# Patient Record
Sex: Male | Born: 1935 | Race: White | Hispanic: No | Marital: Married | Smoking: Never smoker
Health system: Southern US, Community
[De-identification: ages and names within clinical notes are randomized; demographics above are authoritative.]

## PROBLEM LIST (undated history)

## (undated) DIAGNOSIS — Z6820 Body mass index (BMI) 20.0-20.9, adult: Secondary | ICD-10-CM

## (undated) DIAGNOSIS — C801 Malignant (primary) neoplasm, unspecified: Secondary | ICD-10-CM

## (undated) DIAGNOSIS — I10 Essential (primary) hypertension: Secondary | ICD-10-CM

## (undated) DIAGNOSIS — R002 Palpitations: Secondary | ICD-10-CM

## (undated) DIAGNOSIS — N289 Disorder of kidney and ureter, unspecified: Secondary | ICD-10-CM

## (undated) HISTORY — DX: Body mass index (BMI) 20.0-20.9, adult: Z68.20

## (undated) HISTORY — DX: Palpitations: R00.2

---

## 2005-08-31 ENCOUNTER — Ambulatory Visit: Payer: Self-pay | Admitting: Internal Medicine

## 2005-08-31 ENCOUNTER — Ambulatory Visit (HOSPITAL_COMMUNITY): Admission: RE | Admit: 2005-08-31 | Discharge: 2005-08-31 | Payer: Self-pay | Admitting: Internal Medicine

## 2007-06-02 ENCOUNTER — Ambulatory Visit: Admission: RE | Admit: 2007-06-02 | Discharge: 2007-08-31 | Payer: Self-pay | Admitting: Radiation Oncology

## 2007-09-03 ENCOUNTER — Ambulatory Visit: Admission: RE | Admit: 2007-09-03 | Discharge: 2007-10-18 | Payer: Self-pay | Admitting: Radiation Oncology

## 2007-10-10 ENCOUNTER — Encounter: Admission: RE | Admit: 2007-10-10 | Discharge: 2007-10-10 | Payer: Self-pay | Admitting: Urology

## 2007-10-26 ENCOUNTER — Ambulatory Visit (HOSPITAL_BASED_OUTPATIENT_CLINIC_OR_DEPARTMENT_OTHER): Admission: RE | Admit: 2007-10-26 | Discharge: 2007-10-26 | Payer: Self-pay | Admitting: Urology

## 2007-11-16 ENCOUNTER — Ambulatory Visit: Admission: RE | Admit: 2007-11-16 | Discharge: 2007-12-13 | Payer: Self-pay | Admitting: Radiation Oncology

## 2008-05-10 ENCOUNTER — Encounter: Payer: Self-pay | Admitting: Cardiology

## 2008-05-10 ENCOUNTER — Ambulatory Visit: Payer: Self-pay | Admitting: Cardiology

## 2009-10-22 ENCOUNTER — Inpatient Hospital Stay (HOSPITAL_COMMUNITY)
Admission: AD | Admit: 2009-10-22 | Discharge: 2009-10-31 | Disposition: A | Discharge status: Rehabilitation Facility | Payer: Self-pay | Source: Home / Self Care | Admitting: Cardiology

## 2009-10-22 ENCOUNTER — Ambulatory Visit: Payer: Self-pay | Admitting: Cardiovascular Disease

## 2009-10-22 ENCOUNTER — Encounter: Payer: Self-pay | Admitting: Physician Assistant

## 2009-10-22 ENCOUNTER — Encounter: Payer: Self-pay | Admitting: Cardiology

## 2009-10-23 ENCOUNTER — Ambulatory Visit: Payer: Self-pay | Admitting: Thoracic Surgery (Cardiothoracic Vascular Surgery)

## 2009-10-23 ENCOUNTER — Encounter: Payer: Self-pay | Admitting: Thoracic Surgery (Cardiothoracic Vascular Surgery)

## 2009-10-23 ENCOUNTER — Encounter: Payer: Self-pay | Admitting: Cardiovascular Disease

## 2009-10-23 ENCOUNTER — Ambulatory Visit: Payer: Self-pay | Admitting: Cardiology

## 2009-10-24 ENCOUNTER — Encounter: Payer: Self-pay | Admitting: Physician Assistant

## 2009-10-24 ENCOUNTER — Encounter: Payer: Self-pay | Admitting: Cardiovascular Disease

## 2009-10-24 HISTORY — PX: CORONARY ARTERY BYPASS GRAFT: SHX141

## 2009-10-25 ENCOUNTER — Encounter: Payer: Self-pay | Admitting: Physician Assistant

## 2009-10-28 ENCOUNTER — Encounter: Payer: Self-pay | Admitting: Physician Assistant

## 2009-10-29 ENCOUNTER — Encounter: Payer: Self-pay | Admitting: Physician Assistant

## 2009-10-29 ENCOUNTER — Encounter: Payer: Self-pay | Admitting: Thoracic Surgery

## 2009-10-30 ENCOUNTER — Ambulatory Visit: Payer: Self-pay | Admitting: Physical Medicine & Rehabilitation

## 2009-10-31 ENCOUNTER — Inpatient Hospital Stay (HOSPITAL_COMMUNITY)
Admission: RE | Admit: 2009-10-31 | Discharge: 2009-11-05 | Payer: Self-pay | Admitting: Physical Medicine & Rehabilitation

## 2009-10-31 ENCOUNTER — Ambulatory Visit: Payer: Self-pay | Admitting: Physical Medicine & Rehabilitation

## 2009-10-31 ENCOUNTER — Encounter: Payer: Self-pay | Admitting: Physician Assistant

## 2009-11-04 ENCOUNTER — Encounter: Payer: Self-pay | Admitting: Physician Assistant

## 2009-11-12 ENCOUNTER — Encounter (HOSPITAL_COMMUNITY)
Admission: RE | Admit: 2009-11-12 | Discharge: 2009-12-12 | Payer: Self-pay | Source: Home / Self Care | Admitting: Physical Medicine & Rehabilitation

## 2009-11-18 ENCOUNTER — Telehealth (INDEPENDENT_AMBULATORY_CARE_PROVIDER_SITE_OTHER): Payer: Self-pay | Admitting: *Deleted

## 2009-11-26 ENCOUNTER — Telehealth (INDEPENDENT_AMBULATORY_CARE_PROVIDER_SITE_OTHER): Payer: Self-pay | Admitting: *Deleted

## 2009-11-26 ENCOUNTER — Ambulatory Visit: Payer: Self-pay | Admitting: Physician Assistant

## 2009-11-26 ENCOUNTER — Encounter: Payer: Self-pay | Admitting: Cardiology

## 2009-11-26 DIAGNOSIS — J90 Pleural effusion, not elsewhere classified: Secondary | ICD-10-CM | POA: Insufficient documentation

## 2009-11-26 DIAGNOSIS — E785 Hyperlipidemia, unspecified: Secondary | ICD-10-CM | POA: Insufficient documentation

## 2009-11-26 DIAGNOSIS — I6350 Cerebral infarction due to unspecified occlusion or stenosis of unspecified cerebral artery: Secondary | ICD-10-CM | POA: Insufficient documentation

## 2009-11-26 DIAGNOSIS — I2510 Atherosclerotic heart disease of native coronary artery without angina pectoris: Secondary | ICD-10-CM | POA: Insufficient documentation

## 2009-12-09 ENCOUNTER — Encounter
Admission: RE | Admit: 2009-12-09 | Discharge: 2009-12-16 | Payer: Self-pay | Source: Home / Self Care | Admitting: Physical Medicine & Rehabilitation

## 2009-12-12 ENCOUNTER — Encounter: Payer: Self-pay | Admitting: Cardiology

## 2009-12-13 ENCOUNTER — Encounter (HOSPITAL_COMMUNITY)
Admission: RE | Admit: 2009-12-13 | Discharge: 2010-01-12 | Payer: Self-pay | Source: Home / Self Care | Attending: Physical Medicine & Rehabilitation | Admitting: Physical Medicine & Rehabilitation

## 2009-12-16 ENCOUNTER — Encounter: Payer: Self-pay | Admitting: Cardiology

## 2009-12-16 ENCOUNTER — Encounter
Admission: RE | Admit: 2009-12-16 | Discharge: 2009-12-16 | Payer: Self-pay | Admitting: Thoracic Surgery (Cardiothoracic Vascular Surgery)

## 2009-12-16 ENCOUNTER — Ambulatory Visit: Payer: Self-pay | Admitting: Physical Medicine & Rehabilitation

## 2009-12-16 ENCOUNTER — Ambulatory Visit: Payer: Self-pay | Admitting: Thoracic Surgery (Cardiothoracic Vascular Surgery)

## 2010-01-08 ENCOUNTER — Encounter (HOSPITAL_COMMUNITY)
Admission: RE | Admit: 2010-01-08 | Discharge: 2010-02-07 | Payer: Self-pay | Source: Home / Self Care | Attending: Cardiology | Admitting: Cardiology

## 2010-01-15 ENCOUNTER — Encounter (INDEPENDENT_AMBULATORY_CARE_PROVIDER_SITE_OTHER): Payer: Self-pay | Admitting: *Deleted

## 2010-01-15 ENCOUNTER — Encounter (HOSPITAL_COMMUNITY)
Admission: RE | Admit: 2010-01-15 | Discharge: 2010-02-11 | Payer: Self-pay | Source: Home / Self Care | Attending: Physical Medicine & Rehabilitation | Admitting: Physical Medicine & Rehabilitation

## 2010-01-30 ENCOUNTER — Encounter: Payer: Self-pay | Admitting: Cardiology

## 2010-02-11 NOTE — Cardiovascular Report (Signed)
Summary: [place]   [place]   Imported By: Roderic Ovens 11/06/2009 12:24:17  _____________________________________________________________________  External Attachment:    Type:   Image     Comment:   External Document

## 2010-02-11 NOTE — Progress Notes (Signed)
Summary: cardiac rehab  Phone Note Other Incoming   Caller: Burnis Medin - Cardiac Rehab Summary of Call: phone:  [phone]   fax:  [phone]  Checking to see if this had been done yet.  Informed her that OV for 11/10 was cancelled and rescheduled for 11/15.  Advised her that we will be happy to take care of this as soon as he has his post hosp visit.  Diane states that pt is questioning her because he wants to start this after his physical therapy there.  She inform the patient. Initial call taken by: Hoover Brunette, LPN,  November 26, 2009 9:14 AM     Appended Document: cardiac rehab Diane [facility] called back stating that he has seen the PA on 11/15, but nothing was done about him getting order for this per patient.  Advised her that I would forward this note to MD.  She states that she will check to see if initial order from hospital discharge was sent to [facility] and will let us know.    Appended Document: cardiac rehab Di I  need to do anything with this?  Appended Document: cardiac rehab Need okay for cardiac rehab.  Appended Document: cardiac rehab the patient can proceed with cardiac rehabilitation  Appended Document: cardiac rehab Patient notified.   Will send referral to [facility] at pt request.

## 2010-02-11 NOTE — Op Note (Signed)
Summary: Brett Leonard        Imported By: Roderic Ovens 11/06/2009 12:43:01  _____________________________________________________________________  External Attachment:    Type:   Image     Comment:   External Document

## 2010-02-11 NOTE — Consult Note (Signed)
Summary: CARDIOLOGY CONSULT/ [facility]  CARDIOLOGY CONSULT/ [facility]   Imported By: Zachary George 11/26/2009 10:59:54  _____________________________________________________________________  External Attachment:    Type:   Image     Comment:   External Document

## 2010-02-11 NOTE — Consult Note (Signed)
Summary: [place]   [place]   Imported By: Roderic Ovens 11/06/2009 12:42:04  _____________________________________________________________________  External Attachment:    Type:   Image     Comment:   External Document

## 2010-02-11 NOTE — Progress Notes (Signed)
Summary: referral to cardiac rehab for [facility]  Phone Note Other Incoming Call back at [phone]   Caller: Hart Rochester - mgr of cardiac rehab of [facility] Summary of Call: Pt there for PT and will be needing cardiac rehab afterwards.    States she spoke with pt and he wants to come to [facility].  Seeing MD on 11/10.  Please send referral for this.  Initial call taken by: Hoover Brunette, LPN,  November 18, 2009 4:01 PM

## 2010-02-11 NOTE — Miscellaneous (Signed)
Summary: Rehab Report/ FAXED CARDIAC REHAB ORDER  Rehab Report/ FAXED CARDIAC REHAB ORDER   Imported By: Dorise Hiss 12/13/2009 11:21:16  _____________________________________________________________________  External Attachment:    Type:   Image     Comment:   External Document

## 2010-02-11 NOTE — Assessment & Plan Note (Signed)
Summary: Brett Leonard 10/19   Visit Type:  Follow-up Primary Brett Leonard:  Brett Leonard   History of Present Illness: patient presents for initial post CABG clinic visit. He initially presented to Dr. Andee Lineman, here at [facility], with no prior history of heart disease, and had symptoms worrisome for UAP, in the setting of mildly elevated troponins.  Coronary angiography revealed severe, three-vessel CAD with normal LVEF. He was referred to Dr.Owen, who proceeded with four-vessel CABG: LIMA-LAD; SVG-DX1; SVG-OM1; and, SVG-PL branch.  Unfortunately, the patient developed an acute right non hemorrhagic parietal stroke on postop day 3, with associated left hemiparesis. He was placed on aspirin/Plavix, per neurology, and is scheduled to follow with Dr. Anne Hahn.  Clinically, he has done well since discharge from rehabilitation facility on October 25. He is being assisted by OT and PT, but has not yet been cleared to enroll in cardiac rehabilitation. He is scheduled to see Dr. Cornelius Moras, next month. He is ambulating 3 times a day, denies any other chest pain which precipitated his initial presentation. He does complain of occasional, intermittent left pleuritic discomfort, just underneath the left rib. He denies any symptoms suggestive of orthopnea, PND, lower extremity edema.  Preventive Screening-Counseling & Management  Alcohol-Tobacco     Smoking Status: never  Current Medications (verified): 1)  Metoprolol Tartrate 25 Mg Tabs (Metoprolol Tartrate) .... Take 1/4 Tablet By Mouth Two Times A Day 2)  Tamsulosin Hcl 0.4 Mg Caps (Tamsulosin Hcl) .... Take 1 Tablet By Mouth Once A Day 3)  Aspirin 325 Mg Tabs (Aspirin) .... Take 1 Tablet By Mouth Once A Day 4)  Plavix 75 Mg Tabs (Clopidogrel Bisulfate) .... Take 1 Tablet By Mouth Once A Day 5)  Colace 100 Mg Caps (Docusate Sodium) .... Take 2 Tablet By Mouth Once A Day 6)  Oxycodone Hcl 5 Mg Tabs (Oxycodone Hcl) .... Take 1-2 By Mouth Every 4  Hours As Needed 7)  Tylenol 325 Mg Tabs (Acetaminophen) .... Take 1-2 By Mouth Every 4 Hours As Needed  Allergies (verified): 1)  ! Darvocet  Comments:  Nurse/Medical Assistant: The patient's medication list and allergies were reviewed with the patient and were updated in the Medication and Allergy Lists.  Social History: Smoking Status:  never  Review of Systems       No fevers, chills, hemoptysis, dysphagia, melena, hematocheezia, hematuria, rash, claudication, orthopnea, pnd, pedal edema. All other systems negative.   Vital Signs:  Patient profile:   77 year old male Height:      67 inches Weight:      145 pounds BMI:     22.79 Pulse rate:   92 / minute BP sitting:   122 / 80  (left arm) Cuff size:   regular  Vitals Entered By: Carlye Grippe (November 26, 2009 11:12 AM)  Physical Exam  Additional Exam:  GEN: 77 year old male, sitting upright, no distress HEENT: NCAT,PERRLA,EOMI NECK: palpable pulses, no bruits; no JVD; no TM LUNGS: mild left base crackles, no wheezes HEART: RRR (S1S2); no significant murmurs; no rubs; no gallops ABD: soft, NT; intact BS EXT: intact distal pulses; no edema SKIN: warm, drysacrum well-healed sternal midline incision MUSC: no obvious deformity NEURO: A/O (x3)     EKG  Procedure date:  11/26/2009  Findings:      normal sinus rhythm at 91 bpm; normal axis; nonspecific ST changes  Impression & Recommendations:  Problem # 1:  CAD (ICD-414.00)  patient is doing well from a cardiac stand point, following recent  presentation with NSTEMI, and subsequent multivessel CABG. Left ventricular function is normal. Unfortunately, he suffered a postop, nonhemorrhagic stroke. He is slowly regaining strength in his left arm and leg. Recommendation was to treat with concomitant aspirin and Plavix, and he is scheduled to follow with Dr. Anne Hahn. Will defer to Dr. Anne Hahn regarding duration of Plavix therapy, for his stroke. We'll otherwise continue  full dose aspirin. Patient also was placed on low dose metoprolol, although they are unsure of the dose. They will contact our office with the specific dose, at which time I will make a recommendation as to whether or not to increase this. He does present with a high basal heart rate.  Problem # 2:  CEREBRAL INFARCTION (ICD-434.91)  patient progressing slowly. His is receiving occupational and physical therapy. We'll follow up with Dr. Lesia Sago, in [place].  Problem # 3:  PLEURAL EFFUSION, LEFT (ICD-511.9)  Will order a two-view chest x-ray to rule out residual left pleural effusion. We'll check baseline labs, including BNP level. May need to add a diuretic. Of note, he was not discharged on furosemide  Problem # 4:  DYSLIPIDEMIA (ICD-272.4)  Will initiate aggressive lipid management with pravastatin 40 mg daily. We'll need surveillance last in 12 weeks. Spoke extensively with the patient regarding risk/benefits of this class of medications, including potential adverse effects of liver damage or possible rhabdomyolysis.  Other Orders: EKG w/ Interpretation (93000) T-Chest x-ray, 2 views (16109) T-Basic Metabolic Panel (60454-09811) T-BNP  (B Natriuretic Peptide) ([id number])  Patient Instructions: 1)  Your physician wants you to follow-up in: 3 months. You will receive a reminder letter in the mail one-two months in advance. If you don't receive a letter, please call our office to schedule the follow-up appointment. 2)  CALL THE OFFICE WHEN YOU GET HOME WITH THE CORRECT DOSE OF YOUR METOPROLOL. 3)  Your physician recommends that you go to the [facility] for lab work and a chest x-ray. Please do today. 4)  Start Pravachol (pravastatin) 40mg  by mouth at bedtime.  5)  Your physician recommends that you go to the [facility] for a FASTING lipid profile and liver function labs:  DO IN 12 WEEKS. Prescriptions: PRAVASTATIN SODIUM 40 MG TABS (PRAVASTATIN SODIUM) Take one tablet by  mouth daily at bedtime  #30 x 6   Entered by:   Cyril Loosen, RN, BSN   Authorized by:   Nelida Meuse, PA-C   Signed by:   Cyril Loosen, RN, BSN on 11/26/2009   Method used:   Electronically to        [facility] [phone]* (retail)       [address]       [place], [place]  [address]       Ph: [phone] or [phone]       Fax: [phone]   RxID:   [id number]   Handout requested.  I have reviewed and approved all prescriptions at the time of the office visit. Nelida Meuse, PA-C  November 26, 2009 12:09 PM

## 2010-02-11 NOTE — Letter (Signed)
Summary: External Correspondence/ [facility] Dimas Aguas  External Correspondence/ [facility] Dim[facility] Agu[facility]   Imported By: Dorise Hiss 12/04/2009 09:04:14  _____________________________________________________________________  External Attachment:    Type:   Image     Comment:   External Document

## 2010-02-12 ENCOUNTER — Ambulatory Visit (HOSPITAL_COMMUNITY): Payer: Medicare Other | Attending: Cardiology

## 2010-02-12 DIAGNOSIS — Z951 Presence of aortocoronary bypass graft: Secondary | ICD-10-CM | POA: Insufficient documentation

## 2010-02-12 DIAGNOSIS — Z5189 Encounter for other specified aftercare: Secondary | ICD-10-CM | POA: Insufficient documentation

## 2010-02-12 DIAGNOSIS — I200 Unstable angina: Secondary | ICD-10-CM | POA: Insufficient documentation

## 2010-02-12 DIAGNOSIS — I2510 Atherosclerotic heart disease of native coronary artery without angina pectoris: Secondary | ICD-10-CM | POA: Insufficient documentation

## 2010-02-12 DIAGNOSIS — I252 Old myocardial infarction: Secondary | ICD-10-CM | POA: Insufficient documentation

## 2010-02-13 NOTE — Letter (Signed)
Summary: [facility] Office Visit Note   [facility] Office Visit Note   Imported By: Roderic Ovens 01/10/2010 12:04:38  _____________________________________________________________________  External Attachment:    Type:   Image     Comment:   External Document

## 2010-02-13 NOTE — Miscellaneous (Signed)
Summary: Orders Update  Clinical Lists Changes  Orders: Added new Test order of T-Lipid Profile ([id number]) - Signed Added new Test order of T-Hepatic Function ([id number]) - Signed 

## 2010-02-14 ENCOUNTER — Ambulatory Visit (HOSPITAL_COMMUNITY): Payer: Medicare Other | Attending: Cardiology

## 2010-02-14 DIAGNOSIS — Z951 Presence of aortocoronary bypass graft: Secondary | ICD-10-CM | POA: Insufficient documentation

## 2010-02-14 DIAGNOSIS — Z5189 Encounter for other specified aftercare: Secondary | ICD-10-CM | POA: Insufficient documentation

## 2010-02-14 DIAGNOSIS — I2510 Atherosclerotic heart disease of native coronary artery without angina pectoris: Secondary | ICD-10-CM | POA: Insufficient documentation

## 2010-02-17 ENCOUNTER — Ambulatory Visit (HOSPITAL_COMMUNITY): Payer: Medicare Other | Attending: Cardiology

## 2010-02-17 DIAGNOSIS — Z951 Presence of aortocoronary bypass graft: Secondary | ICD-10-CM | POA: Insufficient documentation

## 2010-02-17 DIAGNOSIS — I2510 Atherosclerotic heart disease of native coronary artery without angina pectoris: Secondary | ICD-10-CM | POA: Insufficient documentation

## 2010-02-17 DIAGNOSIS — Z5189 Encounter for other specified aftercare: Secondary | ICD-10-CM | POA: Insufficient documentation

## 2010-02-17 DIAGNOSIS — I200 Unstable angina: Secondary | ICD-10-CM | POA: Insufficient documentation

## 2010-02-19 ENCOUNTER — Ambulatory Visit (INDEPENDENT_AMBULATORY_CARE_PROVIDER_SITE_OTHER): Payer: Medicare Other | Admitting: Cardiology

## 2010-02-19 ENCOUNTER — Ambulatory Visit (HOSPITAL_COMMUNITY): Payer: Medicare Other | Attending: Cardiology

## 2010-02-19 ENCOUNTER — Encounter: Payer: Self-pay | Admitting: Cardiology

## 2010-02-19 DIAGNOSIS — Z5189 Encounter for other specified aftercare: Secondary | ICD-10-CM | POA: Insufficient documentation

## 2010-02-19 DIAGNOSIS — I2510 Atherosclerotic heart disease of native coronary artery without angina pectoris: Secondary | ICD-10-CM | POA: Insufficient documentation

## 2010-02-19 DIAGNOSIS — Z951 Presence of aortocoronary bypass graft: Secondary | ICD-10-CM | POA: Insufficient documentation

## 2010-02-19 DIAGNOSIS — I200 Unstable angina: Secondary | ICD-10-CM | POA: Insufficient documentation

## 2010-02-21 ENCOUNTER — Ambulatory Visit (HOSPITAL_COMMUNITY): Payer: Medicare Other | Attending: Cardiology

## 2010-02-21 DIAGNOSIS — Z5189 Encounter for other specified aftercare: Secondary | ICD-10-CM | POA: Insufficient documentation

## 2010-02-21 DIAGNOSIS — I252 Old myocardial infarction: Secondary | ICD-10-CM | POA: Insufficient documentation

## 2010-02-21 DIAGNOSIS — I2510 Atherosclerotic heart disease of native coronary artery without angina pectoris: Secondary | ICD-10-CM | POA: Insufficient documentation

## 2010-02-21 DIAGNOSIS — I200 Unstable angina: Secondary | ICD-10-CM | POA: Insufficient documentation

## 2010-02-21 DIAGNOSIS — Z951 Presence of aortocoronary bypass graft: Secondary | ICD-10-CM | POA: Insufficient documentation

## 2010-02-24 ENCOUNTER — Ambulatory Visit (HOSPITAL_COMMUNITY): Payer: Medicare Other | Attending: Cardiology

## 2010-02-24 DIAGNOSIS — I2510 Atherosclerotic heart disease of native coronary artery without angina pectoris: Secondary | ICD-10-CM | POA: Insufficient documentation

## 2010-02-24 DIAGNOSIS — Z951 Presence of aortocoronary bypass graft: Secondary | ICD-10-CM | POA: Insufficient documentation

## 2010-02-24 DIAGNOSIS — Z5189 Encounter for other specified aftercare: Secondary | ICD-10-CM | POA: Insufficient documentation

## 2010-02-24 DIAGNOSIS — I200 Unstable angina: Secondary | ICD-10-CM | POA: Insufficient documentation

## 2010-02-26 ENCOUNTER — Ambulatory Visit (HOSPITAL_COMMUNITY): Payer: Medicare Other | Attending: Cardiology

## 2010-02-26 DIAGNOSIS — I2510 Atherosclerotic heart disease of native coronary artery without angina pectoris: Secondary | ICD-10-CM | POA: Insufficient documentation

## 2010-02-26 DIAGNOSIS — Z5189 Encounter for other specified aftercare: Secondary | ICD-10-CM | POA: Insufficient documentation

## 2010-02-26 DIAGNOSIS — Z951 Presence of aortocoronary bypass graft: Secondary | ICD-10-CM | POA: Insufficient documentation

## 2010-02-26 DIAGNOSIS — I200 Unstable angina: Secondary | ICD-10-CM | POA: Insufficient documentation

## 2010-02-27 NOTE — Assessment & Plan Note (Signed)
Summary: [facility]ENT ONLY  3 MO FU /SRS   Visit Type:  Follow-up Primary Provider:  [name]   History of Present Illness: The patient is a 77 year old male with a history of coronary artery bypass grafting 3x complicated by post operative stroke. To the patient still has some weakness in the left arm. Th he reports occasional any typical chest pains but reports no exertional symptoms. He reports no Dyspnea on exertion The patient has recent carotid Dopplers a which showed no significant disease. Overall is doing well after surgery. Your parents no or copy of PND palpitations or syncope . The patient is participating in cardiac rehab.   Preventive Screening-Counseling & Management  Alcohol-Tobacco     Smoking Status: never  Current Medications (verified): 1)  Metoprolol Tartrate 25 Mg Tabs (Metoprolol Tartrate) .... Take 1/2 Tablet By Mouth Two Times A Day 2)  Tamsulosin Hcl 0.4 Mg Caps (Tamsulosin Hcl) .... Take 1 Tablet By Mouth Once A Day 3)  Aspirin 325 Mg Tabs (Aspirin) .... Take 1 Tablet By Mouth Once A Day 4)  Plavix 75 Mg Tabs (Clopidogrel Bisulfate) .... Take 1 Tablet By Mouth Once A Day 5)  Colace 100 Mg Caps (Docusate Sodium) .... Take 1 Tablet By Mouth Once A Day 6)  Oxycodone Hcl 5 Mg Tabs (Oxycodone Hcl) .... Take 1-2 By Mouth Every 4 Hours As Needed 7)  Tylenol 325 Mg Tabs (Acetaminophen) .... Take 1-2 By Mouth Every 4 Hours As Needed 8)  Pravastatin Sodium 40 Mg Tabs (Pravastatin Sodium) .... Take One Tablet By Mouth Daily At Bedtime  Allergies (verified): 1)  ! Darvocet  Comments:  Nurse/Medical Assistant: The patient's medication list and allergies were reviewed with the patient and were updated in the Medication and Allergy Lists.  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  Risk Factors: Smoking  Status: never (02/19/2010)  Past Medical History: Prostate Cancer History of BPH Chest wall pain Coronary artery bypass grafting Post operative stroke followed by Neurology residual decrease in strength left arm   Review of Systems  The patient denies fatigue, malaise, fever, weight gain/loss, vision loss, decreased hearing, hoarseness, chest pain, palpitations, shortness of breath, prolonged cough, wheezing, sleep apnea, coughing up blood, abdominal pain, blood in stool, nausea, vomiting, diarrhea, heartburn, incontinence, blood in urine, muscle weakness, joint pain, leg swelling, rash, skin lesions, headache, fainting, dizziness, depression, anxiety, enlarged lymph nodes, easy bruising or bleeding, and environmental allergies.    Vital Signs:  Patient profile:   77 year old male Height:      67 inches Weight:      144 pounds Pulse rate:   65 / minute BP sitting:   121 / 67  (right arm) Cuff size:   regular  Vitals Entered By: Carlye Grippe (February 19, 2010 3:03 PM)  Physical Exam  Additional Exam:  General: Well-developed, well-nourished in no distress head: Normocephalic and atraumatic eyes PERRLA/EOMI intact, conjunctiva and lids normal nose: No deformity or lesions mouth normal dentition, normal posterior pharynx neck: Supple, no JVD.  No masses, thyromegaly or abnormal cervical nodes lungs: Normal breath sounds bilaterally without wheezing.  Normal percussion heart: regular rate and rhythm with normal S1 and S2, no S3 or S4.  PMI is normal.  No pathological murmurs abdomen: Normal bowel sounds, abdomen is soft and nontender without masses, organomegaly  or hernias noted.  No hepatosplenomegaly musculoskeletal: Back normal, normal gait muscle strength and tone normal pulsus: Pulse is normal in all 4 extremities Extremities: No peripheral pitting edema neurologic: Alert and oriented x 3 skin: Intact without lesions or rashes cervical nodes: No significant  adenopathy psychologic: Normal affect    Impression & Recommendations:  Problem # 1:  CAD (ICD-414.00)  His updated medication list for this problem includes:    Metoprolol Tartrate 25 Mg Tabs (Metoprolol tartrate) .Marland Kitchen... Take 1/2 tablet by mouth two times a day    Aspirin 325 Mg Tabs (Aspirin) .Marland Kitchen... Take 1 tablet by mouth once a day    Plavix 75 Mg Tabs (Clopidogrel bisulfate) .Marland Kitchen... Take 1 tablet by mouth once a day  Problem # 2:  DYSLIPIDEMIA (ICD-272.4) Lipid lowering therapy doing well.  His updated medication list for this problem includes:    Pravastatin Sodium 40 Mg Tabs (Pravastatin sodium) .Marland Kitchen... Take one tablet by mouth daily at bedtime  Problem # 3:  PLEURAL EFFUSION, LEFT (ICD-511.9) No recurrence.   Patient Instructions: 1)  Your physician recommends that you continue on your current medications as directed. Please refer to the Current Medication list given to you today. 2)  Follow up in  6 months

## 2010-02-28 ENCOUNTER — Encounter (HOSPITAL_COMMUNITY): Payer: Medicare Other

## 2010-03-03 ENCOUNTER — Ambulatory Visit (HOSPITAL_COMMUNITY): Payer: Medicare Other

## 2010-03-05 ENCOUNTER — Ambulatory Visit (HOSPITAL_COMMUNITY): Payer: Medicare Other

## 2010-03-07 ENCOUNTER — Ambulatory Visit (HOSPITAL_COMMUNITY): Payer: Medicare Other

## 2010-03-10 ENCOUNTER — Ambulatory Visit (HOSPITAL_COMMUNITY): Payer: Medicare Other

## 2010-03-11 NOTE — Miscellaneous (Signed)
Summary: Rehab Report  Rehab Report   Imported By: Dorise Hiss 03/04/2010 11:26:42  _____________________________________________________________________  External Attachment:    Type:   Image     Comment:   External Document

## 2010-03-12 ENCOUNTER — Ambulatory Visit (HOSPITAL_COMMUNITY): Payer: Medicare Other

## 2010-03-14 ENCOUNTER — Ambulatory Visit (HOSPITAL_COMMUNITY): Payer: Medicare Other | Attending: Cardiology

## 2010-03-14 DIAGNOSIS — Z951 Presence of aortocoronary bypass graft: Secondary | ICD-10-CM | POA: Insufficient documentation

## 2010-03-14 DIAGNOSIS — I2510 Atherosclerotic heart disease of native coronary artery without angina pectoris: Secondary | ICD-10-CM | POA: Insufficient documentation

## 2010-03-14 DIAGNOSIS — I200 Unstable angina: Secondary | ICD-10-CM | POA: Insufficient documentation

## 2010-03-14 DIAGNOSIS — Z5189 Encounter for other specified aftercare: Secondary | ICD-10-CM | POA: Insufficient documentation

## 2010-03-17 ENCOUNTER — Ambulatory Visit (HOSPITAL_COMMUNITY): Payer: Medicare Other

## 2010-03-19 ENCOUNTER — Ambulatory Visit (HOSPITAL_COMMUNITY): Payer: Medicare Other

## 2010-03-21 ENCOUNTER — Ambulatory Visit (HOSPITAL_COMMUNITY): Payer: Medicare Other

## 2010-03-24 ENCOUNTER — Ambulatory Visit (HOSPITAL_COMMUNITY): Payer: Medicare Other

## 2010-03-24 ENCOUNTER — Encounter: Payer: Self-pay | Admitting: Thoracic Surgery (Cardiothoracic Vascular Surgery)

## 2010-03-24 ENCOUNTER — Encounter (INDEPENDENT_AMBULATORY_CARE_PROVIDER_SITE_OTHER): Payer: Medicare Other | Admitting: Thoracic Surgery (Cardiothoracic Vascular Surgery)

## 2010-03-24 DIAGNOSIS — I2510 Atherosclerotic heart disease of native coronary artery without angina pectoris: Secondary | ICD-10-CM

## 2010-03-24 NOTE — Assessment & Plan Note (Signed)
OFFICE VISIT  ORIEN, MAYHALL DOB:  1932-01-08                                        March 24, 2010 CHART #:  [id number]  HISTORY OF PRESENT ILLNESS:  The patient returns for followup status post coronary artery bypass grafting x4 on October 24, 2009.  He was last seen here in the office on December 16, 2009.  Since then he has continued to do quite well.  He states that he has continued to enjoy gradual recovery in the amount that he can use his left upper extremity. He still states that he does not have fine motor use of his fingers and he cannot tie his shoes using his left hand, but otherwise he has enjoyed considerable increase in its mobility and usefulness.  He states that he still gets tired and his strength is not back to normal, but all of this is slowly improving.  He has not had any other problems or complaints and overall he feels like he is doing fine.  The remainder of his review of systems is notable only in that he did have some bright red blood in his stool recently for which he has been holding aspirin for the last few weeks.  PHYSICAL EXAMINATION:  GENERAL:  A well-appearing male.  VITAL SIGNS: Blood pressure 125/74, pulse 76 and regular, and oxygen saturation 97% on room air.  CHEST:  Notable for median sternotomy scar that is healed completely.  The sternum is stable on palpation.  Auscultation reveals clear breath sounds that are symmetrical bilaterally.  CARDIOVASCULAR: Regular rate and rhythm.  No murmurs, rubs, or gallops are noted. ABDOMEN:  Soft and nontender.  EXTREMITIES:  Warm and well perfused. There is no lower extremity edema.  NEUROLOGIC:  The patient has considerable more movement and use of his left arm.  It is still weak and he still does not have fine motor dexterity with his fingers. Overall, this is dramatically improved in comparison with the last time I saw him.  IMPRESSION:  The patient is progressing nicely  and the symptoms from his previous stroke seem to be resolving gradually.  PLAN:  In the future, the patient will call or return to see [place] as needed.  All of his questions have been addressed.  Salvatore Decent. Cornelius Moras, M.D. Electronically Signed  CHO/MEDQ  D:  03/24/2010  T:  03/24/2010  Job:  [id number]  cc:   Learta Codding, MD,FACC Selinda Flavin

## 2010-03-26 ENCOUNTER — Ambulatory Visit (HOSPITAL_COMMUNITY): Payer: Medicare Other

## 2010-03-26 LAB — CBC
HCT: 30.1 % — ABNORMAL LOW (ref 39.0–52.0)
HCT: 34 % — ABNORMAL LOW (ref 39.0–52.0)
Hemoglobin: 10.3 g/dL — ABNORMAL LOW (ref 13.0–17.0)
Hemoglobin: 11.2 g/dL — ABNORMAL LOW (ref 13.0–17.0)
MCH: 29.9 pg (ref 26.0–34.0)
MCH: 30.1 pg (ref 26.0–34.0)
MCHC: 32.9 g/dL (ref 30.0–36.0)
MCHC: 34.2 g/dL (ref 30.0–36.0)
MCV: 88 fL (ref 78.0–100.0)
MCV: 90.7 fL (ref 78.0–100.0)
Platelets: 146 10*3/uL — ABNORMAL LOW (ref 150–400)
Platelets: 275 10*3/uL (ref 150–400)
RBC: 3.42 MIL/uL — ABNORMAL LOW (ref 4.22–5.81)
RBC: 3.75 MIL/uL — ABNORMAL LOW (ref 4.22–5.81)
RDW: 14.5 % (ref 11.5–15.5)
RDW: 14.8 % (ref 11.5–15.5)
WBC: 8.8 10*3/uL (ref 4.0–10.5)
WBC: 9.4 10*3/uL (ref 4.0–10.5)

## 2010-03-26 LAB — BASIC METABOLIC PANEL
BUN: 15 mg/dL (ref 6–23)
BUN: 17 mg/dL (ref 6–23)
CO2: 26 mEq/L (ref 19–32)
CO2: 30 mEq/L (ref 19–32)
Calcium: 8.4 mg/dL (ref 8.4–10.5)
Calcium: 8.5 mg/dL (ref 8.4–10.5)
Chloride: 104 mEq/L (ref 96–112)
Chloride: 104 mEq/L (ref 96–112)
Creatinine, Ser: 1.09 mg/dL (ref 0.4–1.5)
Creatinine, Ser: 1.12 mg/dL (ref 0.4–1.5)
GFR calc Af Amer: >60 mL/min (ref >60)
GFR calc Af Amer: >60 mL/min (ref >60)
GFR calc non Af Amer: >60 mL/min (ref >60)
GFR calc non Af Amer: >60 mL/min (ref >60)
Glucose, Bld: 112 mg/dL — ABNORMAL HIGH (ref 70–99)
Glucose, Bld: 118 mg/dL — ABNORMAL HIGH (ref 70–99)
Potassium: 3.5 mEq/L (ref 3.5–5.1)
Potassium: 4.2 mEq/L (ref 3.5–5.1)
Sodium: 136 mEq/L (ref 135–145)
Sodium: 139 mEq/L (ref 135–145)

## 2010-03-26 LAB — DIFFERENTIAL
Basophils Absolute: 0.1 10*3/uL (ref 0.0–0.1)
Basophils Relative: 1 % (ref 0–1)
Eosinophils Absolute: 0.5 10*3/uL (ref 0.0–0.7)
Neutro Abs: 6 10*3/uL (ref 1.7–7.7)
Neutrophils Relative %: 63 % (ref 43–77)

## 2010-03-26 LAB — GLUCOSE, CAPILLARY: Glucose-Capillary: 145 mg/dL — ABNORMAL HIGH (ref 70–99)

## 2010-03-26 LAB — COMPREHENSIVE METABOLIC PANEL
ALT: 40 U/L (ref 0–53)
Alkaline Phosphatase: 62 U/L (ref 39–117)
BUN: 18 mg/dL (ref 6–23)
CO2: 26 mEq/L (ref 19–32)
Chloride: 106 mEq/L (ref 96–112)
GFR calc non Af Amer: >60 mL/min (ref >60)
Glucose, Bld: 107 mg/dL — ABNORMAL HIGH (ref 70–99)
Potassium: 4.4 mEq/L (ref 3.5–5.1)
Sodium: 137 mEq/L (ref 135–145)
Total Bilirubin: 1.1 mg/dL (ref 0.3–1.2)

## 2010-03-27 LAB — CBC
HCT: 24.6 % — ABNORMAL LOW (ref 39.0–52.0)
HCT: 27.1 % — ABNORMAL LOW (ref 39.0–52.0)
HCT: 39.8 % (ref 39.0–52.0)
Hemoglobin: 13.3 g/dL (ref 13.0–17.0)
Hemoglobin: 9.6 g/dL — ABNORMAL LOW (ref 13.0–17.0)
MCH: 30 pg (ref 26.0–34.0)
MCH: 30.7 pg (ref 26.0–34.0)
MCH: 30.9 pg (ref 26.0–34.0)
MCHC: 33.4 g/dL (ref 30.0–36.0)
MCHC: 34.1 g/dL (ref 30.0–36.0)
MCHC: 35 g/dL (ref 30.0–36.0)
MCHC: 35.4 g/dL (ref 30.0–36.0)
MCV: 86.8 fL (ref 78.0–100.0)
MCV: 87.1 fL (ref 78.0–100.0)
MCV: 88.5 fL (ref 78.0–100.0)
Platelets: 161 10*3/uL (ref 150–400)
Platelets: 185 10*3/uL (ref 150–400)
Platelets: 79 10*3/uL — ABNORMAL LOW (ref 150–400)
RBC: 4.44 MIL/uL (ref 4.22–5.81)
RDW: 13.6 % (ref 11.5–15.5)
RDW: 13.6 % (ref 11.5–15.5)
RDW: 13.8 % (ref 11.5–15.5)
RDW: 14.4 % (ref 11.5–15.5)
WBC: 3.8 10*3/uL — ABNORMAL LOW (ref 4.0–10.5)
WBC: 5.7 10*3/uL (ref 4.0–10.5)
WBC: 7.8 10*3/uL (ref 4.0–10.5)

## 2010-03-27 LAB — POCT I-STAT 3, ART BLOOD GAS (G3+)
Acid-base deficit: 4 mmol/L — ABNORMAL HIGH (ref 0.0–2.0)
Acid-base deficit: 4 mmol/L — ABNORMAL HIGH (ref 0.0–2.0)
Bicarbonate: 20.5 mEq/L (ref 20.0–24.0)
Bicarbonate: 20.7 mEq/L (ref 20.0–24.0)
Bicarbonate: 24.1 mEq/L — ABNORMAL HIGH (ref 20.0–24.0)
Bicarbonate: 24.5 mEq/L — ABNORMAL HIGH (ref 20.0–24.0)
O2 Saturation: 88 %
O2 Saturation: 97 %
O2 Saturation: 98 %
Patient temperature: 35.6
TCO2: 25 mmol/L (ref 0–100)
pCO2 arterial: 39.1 mmHg (ref 35.0–45.0)
pH, Arterial: 7.406 (ref 7.350–7.450)
pO2, Arterial: 100 mmHg (ref 80.0–100.0)
pO2, Arterial: 329 mmHg — ABNORMAL HIGH (ref 80.0–100.0)
pO2, Arterial: 49 mmHg — ABNORMAL LOW (ref 80.0–100.0)
pO2, Arterial: 96 mmHg (ref 80.0–100.0)

## 2010-03-27 LAB — DIFFERENTIAL
Basophils Absolute: 0 10*3/uL (ref 0.0–0.1)
Basophils Relative: 0 % (ref 0–1)
Neutro Abs: 5.6 10*3/uL (ref 1.7–7.7)
Neutrophils Relative %: 78 % — ABNORMAL HIGH (ref 43–77)

## 2010-03-27 LAB — POCT I-STAT 4, (NA,K, GLUC, HGB,HCT)
Glucose, Bld: 102 mg/dL — ABNORMAL HIGH (ref 70–99)
Glucose, Bld: 114 mg/dL — ABNORMAL HIGH (ref 70–99)
Glucose, Bld: 159 mg/dL — ABNORMAL HIGH (ref 70–99)
HCT: 25 % — ABNORMAL LOW (ref 39.0–52.0)
HCT: 26 % — ABNORMAL LOW (ref 39.0–52.0)
HCT: 26 % — ABNORMAL LOW (ref 39.0–52.0)
HCT: 26 % — ABNORMAL LOW (ref 39.0–52.0)
HCT: 26 % — ABNORMAL LOW (ref 39.0–52.0)
HCT: 27 % — ABNORMAL LOW (ref 39.0–52.0)
HCT: 36 % — ABNORMAL LOW (ref 39.0–52.0)
Hemoglobin: 12.2 g/dL — ABNORMAL LOW (ref 13.0–17.0)
Hemoglobin: 8.8 g/dL — ABNORMAL LOW (ref 13.0–17.0)
Hemoglobin: 8.8 g/dL — ABNORMAL LOW (ref 13.0–17.0)
Hemoglobin: 9.2 g/dL — ABNORMAL LOW (ref 13.0–17.0)
Potassium: 3.6 mEq/L (ref 3.5–5.1)
Potassium: 3.8 mEq/L (ref 3.5–5.1)
Potassium: 4.3 mEq/L (ref 3.5–5.1)
Sodium: 134 mEq/L — ABNORMAL LOW (ref 135–145)
Sodium: 144 mEq/L (ref 135–145)

## 2010-03-27 LAB — TYPE AND SCREEN
ABO/RH(D): O POS
Antibody Screen: NEGATIVE
Unit division: 0

## 2010-03-27 LAB — BLOOD GAS, ARTERIAL
Drawn by: 31137
FIO2: 0.21 %
O2 Saturation: 94.6 %
Patient temperature: 98.6
pO2, Arterial: 71.8 mmHg — ABNORMAL LOW (ref 80.0–100.0)

## 2010-03-27 LAB — GLUCOSE, CAPILLARY
Glucose-Capillary: 103 mg/dL — ABNORMAL HIGH (ref 70–99)
Glucose-Capillary: 107 mg/dL — ABNORMAL HIGH (ref 70–99)
Glucose-Capillary: 127 mg/dL — ABNORMAL HIGH (ref 70–99)
Glucose-Capillary: 138 mg/dL — ABNORMAL HIGH (ref 70–99)
Glucose-Capillary: 81 mg/dL (ref 70–99)
Glucose-Capillary: 87 mg/dL (ref 70–99)
Glucose-Capillary: 99 mg/dL (ref 70–99)

## 2010-03-27 LAB — PROTIME-INR: INR: 1.62 — ABNORMAL HIGH (ref 0.00–1.49)

## 2010-03-27 LAB — URINALYSIS, ROUTINE W REFLEX MICROSCOPIC
Glucose, UA: NEGATIVE mg/dL
Hgb urine dipstick: NEGATIVE
Ketones, ur: NEGATIVE mg/dL
Protein, ur: NEGATIVE mg/dL

## 2010-03-27 LAB — PREPARE RBC (CROSSMATCH)

## 2010-03-27 LAB — POCT I-STAT, CHEM 8
BUN: 10 mg/dL (ref 6–23)
Calcium, Ion: 1.21 mmol/L (ref 1.12–1.32)
Chloride: 108 mEq/L (ref 96–112)
Glucose, Bld: 151 mg/dL — ABNORMAL HIGH (ref 70–99)

## 2010-03-27 LAB — PREPARE FRESH FROZEN PLASMA: Unit division: 0

## 2010-03-27 LAB — PLATELET COUNT: Platelets: 100 10*3/uL — ABNORMAL LOW (ref 150–400)

## 2010-03-27 LAB — APTT
aPTT: 47 seconds — ABNORMAL HIGH (ref 24–37)
aPTT: 54 seconds — ABNORMAL HIGH (ref 24–37)

## 2010-03-27 LAB — PREPARE PLATELETS
Unit division: 0
Unit division: 0

## 2010-03-27 LAB — BASIC METABOLIC PANEL
BUN: 11 mg/dL (ref 6–23)
BUN: 15 mg/dL (ref 6–23)
CO2: 23 mEq/L (ref 19–32)
CO2: 26 mEq/L (ref 19–32)
Calcium: 8.5 mg/dL (ref 8.4–10.5)
Calcium: 8.9 mg/dL (ref 8.4–10.5)
Chloride: 108 mEq/L (ref 96–112)
Creatinine, Ser: 0.94 mg/dL (ref 0.4–1.5)
GFR calc non Af Amer: >60 mL/min (ref >60)
GFR calc non Af Amer: >60 mL/min (ref >60)
Glucose, Bld: 128 mg/dL — ABNORMAL HIGH (ref 70–99)
Glucose, Bld: 96 mg/dL (ref 70–99)
Potassium: 4 mEq/L (ref 3.5–5.1)
Sodium: 138 mEq/L (ref 135–145)
Sodium: 140 mEq/L (ref 135–145)

## 2010-03-27 LAB — HEPARIN LEVEL (UNFRACTIONATED): Heparin Unfractionated: 0.23 IU/mL — ABNORMAL LOW (ref 0.30–0.70)

## 2010-03-27 LAB — CARDIAC PANEL(CRET KIN+CKTOT+MB+TROPI)
CK, MB: 1.7 ng/mL (ref 0.3–4.0)
Relative Index: INVALID (ref 0.0–2.5)
Relative Index: INVALID (ref 0.0–2.5)
Total CK: 68 U/L (ref 7–232)
Total CK: 73 U/L (ref 7–232)
Total CK: 80 U/L (ref 7–232)

## 2010-03-27 LAB — HEMOGLOBIN AND HEMATOCRIT, BLOOD: Hemoglobin: 9 g/dL — ABNORMAL LOW (ref 13.0–17.0)

## 2010-03-27 LAB — LIPID PANEL
Cholesterol: 193 mg/dL (ref 0–200)
HDL: 40 mg/dL (ref >39)
HDL: 41 mg/dL (ref >39)
LDL Cholesterol: 142 mg/dL — ABNORMAL HIGH (ref 0–99)
Total CHOL/HDL Ratio: 3.8 RATIO
Total CHOL/HDL Ratio: 4.7 RATIO
VLDL: 10 mg/dL (ref 0–40)

## 2010-03-27 LAB — CREATININE, SERUM
GFR calc Af Amer: >60 mL/min (ref >60)
GFR calc non Af Amer: >60 mL/min (ref >60)

## 2010-03-27 LAB — PREPARE CRYOPRECIPITATE
Unit division: 0
Unit division: 0

## 2010-03-27 LAB — MRSA PCR SCREENING: MRSA by PCR: NEGATIVE

## 2010-03-27 LAB — HEMOGLOBIN A1C: Hgb A1c MFr Bld: 6.1 % — ABNORMAL HIGH (ref <5.7)

## 2010-03-27 LAB — ABO/RH: ABO/RH(D): O POS

## 2010-03-28 ENCOUNTER — Ambulatory Visit (HOSPITAL_COMMUNITY): Payer: Medicare Other

## 2010-03-31 ENCOUNTER — Ambulatory Visit (HOSPITAL_COMMUNITY): Payer: Medicare Other

## 2010-04-01 NOTE — Progress Notes (Signed)
Summary: Office Visit/  TCT OFFICE VISIT  Office Visit/  TCT OFFICE VISIT   Imported By: Dorise Hiss 03/25/2010 11:55:57  _____________________________________________________________________  External Attachment:    Type:   Image     Comment:   External Document

## 2010-04-02 ENCOUNTER — Ambulatory Visit (HOSPITAL_COMMUNITY): Payer: Medicare Other

## 2010-04-04 ENCOUNTER — Ambulatory Visit (HOSPITAL_COMMUNITY): Payer: Medicare Other

## 2010-04-24 ENCOUNTER — Telehealth: Payer: Self-pay | Admitting: Cardiology

## 2010-04-24 ENCOUNTER — Ambulatory Visit (INDEPENDENT_AMBULATORY_CARE_PROVIDER_SITE_OTHER): Payer: Medicare Other | Admitting: Internal Medicine

## 2010-04-24 DIAGNOSIS — Z800 Family history of malignant neoplasm of digestive organs: Secondary | ICD-10-CM

## 2010-04-24 DIAGNOSIS — K625 Hemorrhage of anus and rectum: Secondary | ICD-10-CM

## 2010-04-28 NOTE — Telephone Encounter (Signed)
Will fax note to [facility] w/ Dr. Patty Sermons office.

## 2010-04-28 NOTE — Telephone Encounter (Signed)
Checking to see if we had answer about colonoscopy.

## 2010-04-28 NOTE — Telephone Encounter (Signed)
Patient had bypass surgery did not have stents placed. Aspirin and Plavix was started last year for postoperative stroke. Plavix conventionally be held 7 days prior to colonoscopy. If possible aspirin can be continued, however if aspirin needs to be held also for biopsies then this can be held also for 7 days beforehand. In summary colonoscopy can definitely be done off Plavix continue aspirin if possible but can also be discontinued a week before Colonoscopy and restart the medications as soon as possible afterwards.

## 2010-05-02 ENCOUNTER — Encounter (HOSPITAL_BASED_OUTPATIENT_CLINIC_OR_DEPARTMENT_OTHER): Payer: Medicare Other | Admitting: Internal Medicine

## 2010-05-02 ENCOUNTER — Other Ambulatory Visit (INDEPENDENT_AMBULATORY_CARE_PROVIDER_SITE_OTHER): Payer: Self-pay | Admitting: Internal Medicine

## 2010-05-02 ENCOUNTER — Ambulatory Visit (HOSPITAL_COMMUNITY)
Admission: RE | Admit: 2010-05-02 | Discharge: 2010-05-02 | Disposition: A | Discharge status: Home / Self Care | Payer: Medicare Other | Source: Ambulatory Visit | Attending: Internal Medicine | Admitting: Internal Medicine

## 2010-05-02 DIAGNOSIS — D126 Benign neoplasm of colon, unspecified: Secondary | ICD-10-CM | POA: Insufficient documentation

## 2010-05-02 DIAGNOSIS — Z800 Family history of malignant neoplasm of digestive organs: Secondary | ICD-10-CM

## 2010-05-02 DIAGNOSIS — K6289 Other specified diseases of anus and rectum: Secondary | ICD-10-CM

## 2010-05-02 DIAGNOSIS — K644 Residual hemorrhoidal skin tags: Secondary | ICD-10-CM

## 2010-05-02 DIAGNOSIS — K921 Melena: Secondary | ICD-10-CM

## 2010-05-02 DIAGNOSIS — K5730 Diverticulosis of large intestine without perforation or abscess without bleeding: Secondary | ICD-10-CM

## 2010-05-02 DIAGNOSIS — I789 Disease of capillaries, unspecified: Secondary | ICD-10-CM | POA: Insufficient documentation

## 2010-05-18 NOTE — Consult Note (Addendum)
NAMEMarland Kitchen  Brett, Leonard              ACCOUNT NO.:  [id number]  MEDICAL RECORD NO.:  [id number]           PATIENT TYPE: AMB.  LOCATION: [place].                   [facility]  PHYSICIAN:  Lionel December, M.D.    DATE OF BIRTH:  March 27, 1931  DATE:  04/24/2010                                 CONSULTATION   REASON FOR CONSULTATION: rectal bleeding  HISTORY OF PRESENT ILLNESS:  Brett Leonard is a 77 year old white male referred to our office by Dr. Selinda Flavin in [place] for rectal bleeding. He states he was seen approximately 5 weeks ago by Dr. Neita Carp for his rectal bleeding and was told he had a rectal tear.  He states for the next 3 weeks he did not see any blood.  Then, he states he saw a small amount of blood in the stool.  The last episode of any blood in his stool was approximately 1 week ago.  On April 14, 2010, he did have a positive Hemoccult.  On March 18, 2010, his H and H was 14.2 and 41.3, his MCV 87.  There is a family history of colon cancer.  His father had colon cancer and one brother had colon cancer, but they are now deceased.  His father, however, did not die from colon cancer.  He died from pulmonary embolus.  His last colonoscopy was in 2007 by Dr. Karilyn Cota, which revealed a normal colonoscopy except for small external hemorrhoids.  He states he has lost about 20 pounds since undergoing a CABG in October 2011.  He also suffered a CVA 3 days postop from his CABG surgery.  There have been no appetite changes.  No dysphagia.  He denies any melena.  His appetite is okay and he is having one to two stools a day.  He denies any abdominal pain.  He is allergic to DARVOCET.  PAST SURGICAL HISTORY:  CABG with four-vessel bypass, October 24, 2009. He has had a left rotator cuff repair.  PAST MEDICAL HISTORY:  Prostate cancer with treatment.  He has had an MI and he had a CVA after undergoing a CABG in October 2011.  FAMILY HISTORY:  His mother is deceased from  natural causes; father is deceased from colon cancer/pulmonary embolus; four sisters, one is alive in good health, three are deceased; five brothers, one is alive and four are deceased, one, however, did have colon cancer.  He is married.  He is retired Airline pilot at [employer].  He does not smoke, drink, or do drugs, and he has three children in good health.  HOME MEDICATIONS:  He is on Tylenol as needed, Plavix 75 mg a day, Colace as needed, Lopressor, oxycodone, Flomax 0.4 mg.  The Lopressor is 12.5 mg, and he is on aspirin 81 mg a day and 40 mg of pravastatin.  OBJECTIVE:  VITAL SIGNS:  His weight is 142, height 5 feet 7 inches, temperature 97.2, blood pressure 112/82.  His pulse is 72. HEENT:  He has natural teeth.  His oral mucosa is moist.  There are no lesions.  His conjunctivae are pink.  His sclerae are anicteric. NECK:  His thyroid is normal.  There  is no cervical lymphadenopathy. LUNGS:  Clear. HEART:  Regular rate and rhythm. ABDOMEN:  Soft.  Bowel sounds are positive.  No masses. EXTREMITIES:  He does have some edema to his left lower extremity  ASSESSMENT:  Brett Leonard is a 77 year old male presenting today with a recent history of rectal bleeding.  There is a family history of colon cancer in his family in first-degree relative.  A colonic neoplasm does need to be ruled out.  RECOMMENDATIONS:  We will schedule a colonoscopy with Dr. Karilyn Cota in the near future.  The risk and benefits were reviewed with the patient.  He is agreeable.  Dr. Andee Lineman manages his Plavix, and he will be contacted.  Thank you for allowing [place] to participate in the care of this very nice gentleman.    ______________________________ Dorene Ar, NP   ______________________________ Lionel December, M.D.    TS/MEDQ  D:  04/24/2010  T:  04/25/2010  Job:  [id number]  cc:   Selinda Flavin Fax: [phone]  Electronically Signed by Dorene Ar NP on 05/13/2010 09:02:16  AM Electronically Signed by Lionel December M.D. on 05/18/2010 09:02:04 PM

## 2010-05-18 NOTE — Op Note (Signed)
NAME:  DEMARIUS, Brett Leonard NO.:  [id number]  MEDICAL RECORD NO.:  [id number]           PATIENT TYPE:  O  LOCATION:  DAYP                          FACILITY:  [facility]  PHYSICIAN:  Lionel December, M.D.    DATE OF BIRTH:  07-Mar-1931  DATE OF PROCEDURE:  05/02/2010 DATE OF DISCHARGE:                              OPERATIVE REPORT   PROCEDURE:  Colonoscopy with snare polypectomy and APC ablation of rectal telangiectasia with oozing/active bleeding.  INDICATION:  Brett Leonard is 77 year old Caucasian male with family history of colon carcinoma whose last colonoscopy was in August 2007, who has experienced two episodes of hematochezia recently.  He is undergoing diagnostic colonoscopy.  Family history is positive for colon carcinoma in father and one brother and they both died in their early 26s. Procedures were reviewed with the patient.  Informed consent was obtained.  MEDS FOR CONSCIOUS SEDATION: 1. Demerol 25 mg IV. 2. Versed 3 mg IV.  FINDINGS:  Procedure performed in endoscopy suite.  The patient's vital signs and O2 sat were monitored during the procedure and remained stable.  The patient was placed in left lateral recumbent position and rectal examination performed.  No abnormality noted on external or digital exam.  Pentax videoscope was placed through rectum and advanced under vision into sigmoid colon and beyond.  Preparation was excellent. Few small diverticula noted at sigmoid colon.  Scope was passed into cecum which was identified by appendiceal orifice and ileocecal valve. Pictures were taken for the record.  As the scope was withdrawn, colonic mucosa was carefully examined.  There were two small polyps at transverse colon, one was about 6 mm, another one much smaller.  The larger polyp was hot snared and retrieved, smaller one was cold snared and also retrieved.  Both of these polyps were submitted in one container.  Mucosa and rest of the colon was normal until  back in the rectum where there was fresh blood.  This was at distal rectum.  This area was washed and underlying telangiectasia were noted.  They were actually two without oozing.  Hemorrhoids were noted below the dentate line along with the small anal papilla.  Using argon plasma coagulator, these two telangiectasia and few more were ablated.  They were one's which were very close to the dentate line or left alone.  These were not bleeding.  Endoscope was then withdrawn.  Withdrawal time was 18 minutes.  The patient tolerated the procedure well.  FINAL DIAGNOSES: 1. Examination performed to cecum. 2. Few small diverticula at sigmoid colon. 3. Two polyps snared from transverse colon.  Both of these were small,     one was hot snared and one cold snared. 4. Rectal telangiectasia with oozing (radiation proctitis).  These and     others were ablated with argon plasma coagulator. 5. External hemorrhoids and small anal papilla.  RECOMMENDATIONS: 1. High-fiber diet.  He will resume his Plavix on May 05, 2010, and     his aspirin in 1 week. 2. I will be contacting the patient with results of biopsy and further     recommendations.  Lionel December, M.D.     NR/MEDQ  D:  05/02/2010  T:  05/02/2010  Job:  [id number]  cc:   Selinda Flavin Fax: [phone]  Electronically Signed by Lionel December M.D. on 05/18/2010 09:00:58 PM

## 2010-05-27 NOTE — Op Note (Signed)
NAME:  Brett Leonard, Brett Leonard NO.:  [id number]   MEDICAL RECORD NO.:  [id number]          PATIENT TYPE:  AMB   LOCATION:  [facility]                         FACILITY:  [facility]   PHYSICIAN:  Ronald L. Earlene Plater, M.D.  DATE OF BIRTH:  Aug 06, 1931   DATE OF PROCEDURE:  10/26/2007  DATE OF DISCHARGE:                               OPERATIVE REPORT   PREOPERATIVE DIAGNOSIS:  Adenocarcinoma, prostate, clinically localized   POSTOPERATIVE DIAGNOSIS:  Adenocarcinoma, prostate, clinically  localized.   PROCEDURE.:  1. Flexible cystourethroscopy.  2. Transrectal ultrasound of the prostate, biplanar.  3. Transperineal implantation of iodine-125 seeds into the prostate      utilizing robotic Nucletron.   ATTENDING PHYSICIAN:  Dr. Gaynelle Arabian.   RESIDENT PHYSICIAN:  Dr. Delman Kitten, M.D.   RADIATION ONCOLOGIST:  Dr. Dayton Scrape.   ANESTHESIA:  General endotracheal.   BLOOD LOSS:  Negligible.   DRAINS:  A 16-French Foley catheter to straight drain.   COMPLICATIONS:  None.   TOTAL NUMBER OF SEEDS IMPLANTED:  60.   TOTAL NUMBER OF SEEDS IN PLACE:  59 with one being removed from the  membranous urethra.   TOTAL APPARENT ACTIVITY:  21.653 mCi.   NUMBER OF NEEDLES:  28.   INDICATIONS FOR PROCEDURE:  Mr. Hurn is a 77 year old white male  diagnosed with intermediate stage adenocarcinoma of prostate, Gleason 3  + 4 = 7.  He is undergoing IMRT and presents today for brachytherapy  boost.  He has had fiducial marking seeds placed for his IMRT by Dr.  Earlene Plater.  Preoperatively, all risks, benefits, consequences and concerns  were discussed and informed consent was obtained.   PROCEDURE IN DETAIL:  The patient was brought to the operating room and  placed in a supine position.  He was correctly identified by his  wristband and an appropriate time-out was taken.  IV antibiotics were  administered and general anesthesia was delivered.  He was then placed  in the dorsal lithotomy position.   Great care was taken to minimize the  risk of peripheral neuropathy or compartment syndrome.  His perineum was  prepped and draped in a normal sterile fashion.  A 16-French Foley  catheter was placed in the bladder.  The transrectal biplane or B-K  transrectal ultrasound probe was placed and the prostate was sized.  Dosimetry was performed by Dr. Dayton Scrape.  Holding needles were placed in  unused coordinates utilizing a physical electronic grid.  Initial needle  was placed utilizing the sagittal scanning.  The prescribed coordinate  and base was localized and the seeds were implanted, serial implantation  was then performed.  We implanted a total of 6 mL via 28 needles with  total apparent activity of 21.653 mCi.  Static fluoroscopic image  confirmed the location of the seeds.  All of the hardware was removed  and the wound was dressed sterilely.  Flexible cystourethroscopy was  then performed.  The bladder and mucosa were normal.  There were no  seeds or spacers in the bladder.  Both ureteral orifices were noted to  be in their normal anatomic position effluxing clear  urine.  Upon  advancing the cystoscope into the urethra, there was a seed present in  the 6 o'clock position in the membranous urethra.  We used flexible  graspers to remove this without any difficulty.  This was passed off of  the table.  The rest of the urethra was cleaned.  We then placed a new  Foley catheter per urethra into the bladder and inflated the balloon  with 10 mL of sterile water.  It drained clear urine and this marked the  end of our procedure.  He awoke from anesthesia and was taken to  recovery room in stable condition.  There were no complications.  He  tolerated the procedure well.  Dr. Earlene Plater was present and participated in  all aspects of case.      Delman Kitten, MD      Lucrezia Starch. Earlene Plater, M.D.  Electronically Signed    DW/MEDQ  D:  10/26/2007  T:  10/26/2007  Job:  [id number]

## 2010-05-27 NOTE — Assessment & Plan Note (Signed)
OFFICE VISIT   Brett, Leonard  DOB:  1932-01-08                                        December 16, 2009  CHART #:  [id number]   HISTORY OF PRESENT ILLNESS:  The patient returns for routine followup  status post coronary artery bypass grafting x4 on October 24, 2009.  His  postoperative recovery was uncomplicated from a cardiac standpoint,  although he did suffer a small nonhemorrhagic right parietal stroke  manifested as left upper extremity numbness and weakness.  Since  hospital discharge, he has continued to improve.  He was seen in  followup by Dr. Cato Mulligan from the Rehab [facility] earlier today and has now  been discharged from his care.  He continues to work with occupational  therapist and he has enjoyed some improvement in strength and use of his  left arm.  However, he still has a significant residual deficit.  He  seems to be doing remarkably well in every other respect.  He has not  had any significant pain in his chest and he has not used any sort of  pain relievers other than Tylenol.  He denies any shortness of breath.  His appetite is normal.  He is sleeping as well as he ever has prior to  surgery.  He is getting around well without any other complaints.   PHYSICAL EXAMINATION:  Notable for well-appearing male with blood  pressure 121/74, pulse 85, and oxygen saturation 97% on room air.  Examination of the chest reveals median sternotomy incision that is  healing nicely.  The sternum is stable on palpation.  Breath sounds are  clear to auscultation and symmetrical bilaterally.  No wheezes, rales,  or rhonchi are noted.  Cardiovascular exam includes regular rate and  rhythm.  No murmurs, rubs, or gallops are appreciated.  The abdomen is  soft and nontender.  The extremities are warm and well perfused.  The  small incision from endoscopic vein harvest just above the right knee  has healed nicely.  There is no lower extremity edema.  The  remainder of  physical exam is notable in that the patient has significant residual  numbness and weakness involving his entire left upper extremity.  However, he has enjoyed some improved use and improved sensation since I  last saw him at the time of hospital discharge.   DIAGNOSTIC TESTS:  Chest x-ray performed today at the [facility] is reviewed.  This demonstrates clear lung fields bilaterally.  There are no pleural effusions.  No other abnormalities are noted.   IMPRESSION:  Satisfactory progress following recent coronary artery  bypass grafting.  The patient suffered a small parietal stroke on the  right side that has caused left upper extremity numbness and weakness.  His use of his arm seems to be improving and he is doing well with  occupational therapy.  He is otherwise clinically doing exceptionally  well.   PLAN:  I think it is reasonable for the patient to go ahead and get  started in the cardiac rehab program.  In addition, because his  afflicted arm is nondominant, I think it would be reasonable for him to  start driving an automobile with automatic transmission.  I have  reminded him that he should refrain from any heavy lifting or strenuous  use of his arms  or shoulders.  All of his questions have been addressed.  He will plan to return to see [place] in 3 months.   Salvatore Decent. Cornelius Moras, M.D.  Electronically Signed   CHO/MEDQ  D:  12/16/2009  T:  12/17/2009  Job:  [id number]   cc:   Learta Codding, MD,FACC  Selinda Flavin

## 2011-08-06 IMAGING — CT CT ANGIO NECK
1 of 9 series · 2 of 33 positions shown · IV contrast (omnipaque)
Comparison: CT head 10/28/2009

CTA NECK

CLINICAL DATA: Stroke.  Recent CABG.  Prostate cancer.

CT ANGIOGRAPHY HEAD AND NECK
TECHNIQUE: Multidetector CT imaging of the head and neck was
performed using the standard protocol during bolus administration
of intravenous contrast.  Multiplanar CT image reconstructions
including MIPs were obtained to evaluate the vascular anatomy.
Carotid stenosis measurements (when applicable) are obtained
utilizing NASCET criteria, using the distal internal carotid
diameter as the denominator.
Contrast:  100 ml Omnipaque 350 IV

[Series 7: angio 2mm · axial · 0.46mm/px · z∈[-186,-80]mm · 2 of 160 slices shown]
[im 54/160  soft-tissue]
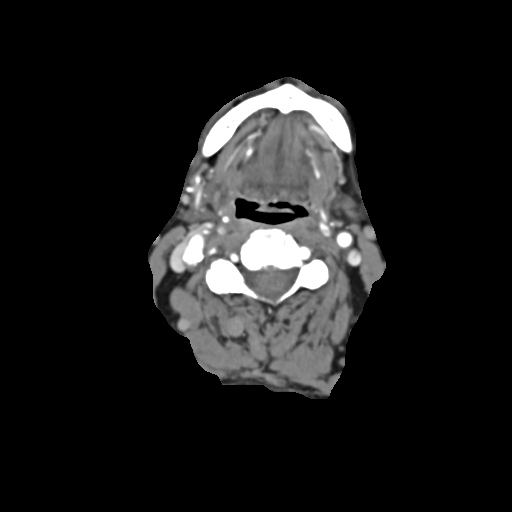
[im 107/160  bone]
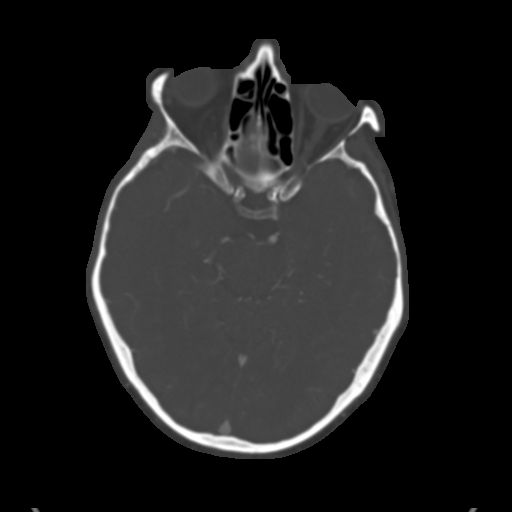

[2 of 33 positions shown; findings below may reference images not displayed]

FINDINGS: The right common carotid artery is widely patent.  There
is atherosclerotic calcification involving the proximal right
internal carotid artery, not causing significant luminal stenosis.
Negative for carotid dissection.  The right internal carotid artery
is widely patent to the skull base.

The left common carotid artery is widely patent without stenosis.
Atherosclerotic calcification is present in the proximal left
internal carotid artery without significant stenosis.  Left
internal carotid artery is patent to the skull base.

Calcified plaque at the origin of the vertebral artery bilaterally
without significant stenosis.  The left vertebral artery is
dominant.  Both vertebral arteries are patent to the basilar.  No
significant vertebral stenosis or dissection.

 Review of the MIP images confirms the above findings.
IMPRESSION: Atherosclerotic calcification in the proximal vertebral artery
bilaterally and in the proximal internal carotid artery bilaterally
without significant stenosis.

CTA HEAD
FINDINGS: Postcontrast imaging of the brain reveals an  ill-
defined hypodensity in the right posterior frontal cortex as noted
on the  unenhanced  study earlier today.  This is most compatible
with acute infarct.  This lesion does not enhance.  No enhancing
lesions are identified.

Extensive atherosclerotic calcification throughout the cavernous
carotid bilaterally.  Luminal diameter is difficult to accurately
assess due to vascular calcification however I would estimate there
is moderately severe stenosis in the cavernous carotid bilaterally.

The anterior and middle cerebral arteries are patent bilaterally
without atherosclerotic calcification or plaque.

The left vertebral artery is dominant.  Both vertebral arteries
contribute to the basilar.  PICA, AICA, superior cerebellar, and
posterior cerebral arteries are patent bilaterally without
significant stenosis.

Negative for cerebral aneurysm.

 Review of the MIP images confirms the above findings.
IMPRESSION: There is extensive atherosclerotic calcification in the cavernous
carotid bilaterally with estimated moderate luminal stenosis.  No
significant   large vessel occlusion.

## 2014-12-07 DIAGNOSIS — Z955 Presence of coronary angioplasty implant and graft: Secondary | ICD-10-CM | POA: Diagnosis not present

## 2014-12-07 DIAGNOSIS — I2510 Atherosclerotic heart disease of native coronary artery without angina pectoris: Secondary | ICD-10-CM | POA: Diagnosis not present

## 2014-12-07 DIAGNOSIS — S8991XA Unspecified injury of right lower leg, initial encounter: Secondary | ICD-10-CM | POA: Diagnosis not present

## 2014-12-07 DIAGNOSIS — I10 Essential (primary) hypertension: Secondary | ICD-10-CM | POA: Diagnosis not present

## 2014-12-07 DIAGNOSIS — W228XXA Striking against or struck by other objects, initial encounter: Secondary | ICD-10-CM | POA: Diagnosis not present

## 2014-12-07 DIAGNOSIS — S8011XA Contusion of right lower leg, initial encounter: Secondary | ICD-10-CM | POA: Diagnosis not present

## 2014-12-07 DIAGNOSIS — M79661 Pain in right lower leg: Secondary | ICD-10-CM | POA: Diagnosis not present

## 2014-12-07 DIAGNOSIS — M7989 Other specified soft tissue disorders: Secondary | ICD-10-CM | POA: Diagnosis not present

## 2014-12-07 DIAGNOSIS — M79604 Pain in right leg: Secondary | ICD-10-CM | POA: Diagnosis not present

## 2014-12-07 DIAGNOSIS — Z951 Presence of aortocoronary bypass graft: Secondary | ICD-10-CM | POA: Diagnosis not present

## 2014-12-07 DIAGNOSIS — E785 Hyperlipidemia, unspecified: Secondary | ICD-10-CM | POA: Diagnosis not present

## 2014-12-08 DIAGNOSIS — S8011XA Contusion of right lower leg, initial encounter: Secondary | ICD-10-CM | POA: Diagnosis not present

## 2014-12-11 DIAGNOSIS — S8011XA Contusion of right lower leg, initial encounter: Secondary | ICD-10-CM | POA: Diagnosis not present

## 2019-02-03 DIAGNOSIS — Z23 Encounter for immunization: Secondary | ICD-10-CM | POA: Diagnosis not present

## 2019-08-25 DIAGNOSIS — E7800 Pure hypercholesterolemia, unspecified: Secondary | ICD-10-CM | POA: Diagnosis not present

## 2019-08-25 DIAGNOSIS — Z0001 Encounter for general adult medical examination with abnormal findings: Secondary | ICD-10-CM | POA: Diagnosis not present

## 2019-08-25 DIAGNOSIS — N411 Chronic prostatitis: Secondary | ICD-10-CM | POA: Diagnosis not present

## 2019-09-01 DIAGNOSIS — E785 Hyperlipidemia, unspecified: Secondary | ICD-10-CM | POA: Diagnosis not present

## 2019-09-01 DIAGNOSIS — N400 Enlarged prostate without lower urinary tract symptoms: Secondary | ICD-10-CM | POA: Diagnosis not present

## 2019-09-01 DIAGNOSIS — Z6820 Body mass index (BMI) 20.0-20.9, adult: Secondary | ICD-10-CM | POA: Diagnosis not present

## 2019-09-01 DIAGNOSIS — Z1389 Encounter for screening for other disorder: Secondary | ICD-10-CM | POA: Diagnosis not present

## 2019-09-01 DIAGNOSIS — J302 Other seasonal allergic rhinitis: Secondary | ICD-10-CM | POA: Diagnosis not present

## 2019-09-01 DIAGNOSIS — Z1331 Encounter for screening for depression: Secondary | ICD-10-CM | POA: Diagnosis not present

## 2019-09-01 DIAGNOSIS — I25119 Atherosclerotic heart disease of native coronary artery with unspecified angina pectoris: Secondary | ICD-10-CM | POA: Diagnosis not present

## 2019-09-08 DIAGNOSIS — H40013 Open angle with borderline findings, low risk, bilateral: Secondary | ICD-10-CM | POA: Diagnosis not present

## 2019-09-14 DIAGNOSIS — Z8546 Personal history of malignant neoplasm of prostate: Secondary | ICD-10-CM | POA: Diagnosis not present

## 2019-09-14 DIAGNOSIS — Z8673 Personal history of transient ischemic attack (TIA), and cerebral infarction without residual deficits: Secondary | ICD-10-CM | POA: Diagnosis not present

## 2019-09-14 DIAGNOSIS — E7800 Pure hypercholesterolemia, unspecified: Secondary | ICD-10-CM | POA: Diagnosis not present

## 2019-09-14 DIAGNOSIS — Z0001 Encounter for general adult medical examination with abnormal findings: Secondary | ICD-10-CM | POA: Diagnosis not present

## 2019-09-14 DIAGNOSIS — Z6820 Body mass index (BMI) 20.0-20.9, adult: Secondary | ICD-10-CM | POA: Diagnosis not present

## 2020-04-17 DIAGNOSIS — L03019 Cellulitis of unspecified finger: Secondary | ICD-10-CM | POA: Diagnosis not present

## 2020-04-17 DIAGNOSIS — L603 Nail dystrophy: Secondary | ICD-10-CM | POA: Diagnosis not present

## 2020-07-22 DIAGNOSIS — R002 Palpitations: Secondary | ICD-10-CM | POA: Diagnosis not present

## 2020-07-22 DIAGNOSIS — Z6820 Body mass index (BMI) 20.0-20.9, adult: Secondary | ICD-10-CM | POA: Diagnosis not present

## 2020-07-22 DIAGNOSIS — M549 Dorsalgia, unspecified: Secondary | ICD-10-CM | POA: Diagnosis not present

## 2020-07-29 ENCOUNTER — Encounter: Payer: Self-pay | Admitting: Internal Medicine

## 2020-07-29 ENCOUNTER — Ambulatory Visit (INDEPENDENT_AMBULATORY_CARE_PROVIDER_SITE_OTHER): Payer: Medicare PPO

## 2020-07-29 ENCOUNTER — Ambulatory Visit (INDEPENDENT_AMBULATORY_CARE_PROVIDER_SITE_OTHER): Payer: Medicare PPO | Admitting: Internal Medicine

## 2020-07-29 ENCOUNTER — Other Ambulatory Visit: Payer: Self-pay

## 2020-07-29 VITALS — BP 110/64 | HR 56 | Ht 67.0 in | Wt 130.0 lb

## 2020-07-29 DIAGNOSIS — R55 Syncope and collapse: Secondary | ICD-10-CM | POA: Diagnosis not present

## 2020-07-29 DIAGNOSIS — Z951 Presence of aortocoronary bypass graft: Secondary | ICD-10-CM

## 2020-07-29 NOTE — Patient Instructions (Signed)
Medication Instructions:  Your physician recommends that you continue on your current medications as directed. Please refer to the Current Medication list given to you today.  *If you need a refill on your cardiac medications before your next appointment, please call your pharmacy*   Lab Work: We have requested lab work from your primary care provider  If you have labs (blood work) drawn today and your tests are completely normal, you will receive your results only by: [place] (if you have MyChart) OR A paper copy in the mail If you have any lab test that is abnormal or we need to change your treatment, we will call you to review the results.   Testing/Procedures: Your physician has requested that you have a carotid duplex. This test is an ultrasound of the carotid arteries in your neck. It looks at blood flow through these arteries that supply the brain with blood. Allow one hour for this exam. There are no restrictions or special instructions.  Your physician has requested that you have an echocardiogram. Echocardiography is a painless test that uses sound waves to create images of your heart. It provides your doctor with information about the size and shape of your heart and how well your heart's chambers and valves are working. This procedure takes approximately one hour. There are no restrictions for this procedure.  Your physician has requested that you have a lexiscan myoview. For further information please visit [email or web address]. Please follow instruction sheet, as given.    Follow-Up: At [facility], you and your health needs are our priority.  As part of our continuing mission to provide you with exceptional heart care, we have created designated Provider Care Teams.  These Care Teams include your primary Cardiologist (physician) and Advanced Practice Providers (APPs -  Physician Assistants and Nurse Practitioners) who all work together to provide you with the care you  need, when you need it.  We recommend signing up for the patient portal called "MyChart".  Sign up information is provided on this After Visit Summary.  MyChart is used to connect with patients for Virtual Visits (Telemedicine).  Patients are able to view lab/test results, encounter notes, upcoming appointments, etc.  Non-urgent messages can be sent to your provider as well.   To learn more about what you can do with MyChart, go to [email or web address].    Your next appointment:   F/U: Pending   Other Instructions ZIO AT  Call [place] with any questions during wear 24/7: [phone] Zio patch should be worn for 14 days unless otherwise instructed The [employer] ([employer]) will call you 24-48 hrs. after Elwyn Reach is applied, be sure to answer the phone from a 224 area code.  They may call during wear with important information regarding your heart, so answer any calls from iRhythm Do not shower for 24 hours after Zio is applied (sponge bath is fine, just keep the patch dry) After that, when showering avoid direct shower stream of water onto Zio patch, pat dry around the patch Avoid excessive sweating Push the button when you are feeling any cardiac symptoms and record them in the logbook or on the Zio app Refer to the removal date listed on the front of the symptom logbook and remove Zio patch according to the instructions in the back of the logbook Place Zio patch inside transmitter (sticky side facing up, button facing down) and put both the gateway and symptom logbook in the prepaid mailing envelope included in the back of the  transmitter.  Place inside mailbox or any USPS mailbox
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
## 2020-07-30 DIAGNOSIS — R002 Palpitations: Secondary | ICD-10-CM | POA: Diagnosis not present

## 2020-07-30 DIAGNOSIS — H6120 Impacted cerumen, unspecified ear: Secondary | ICD-10-CM | POA: Diagnosis not present

## 2020-07-30 DIAGNOSIS — Z6820 Body mass index (BMI) 20.0-20.9, adult: Secondary | ICD-10-CM | POA: Diagnosis not present

## 2020-07-31 ENCOUNTER — Telehealth: Payer: Self-pay | Admitting: *Deleted

## 2020-07-31 DIAGNOSIS — Z13220 Encounter for screening for lipoid disorders: Secondary | ICD-10-CM

## 2020-07-31 NOTE — Telephone Encounter (Signed)
Pt and daughter notified that pt needs to have labs done.

## 2020-07-31 NOTE — Telephone Encounter (Signed)
-----   Message from Fay Records, MD sent at 07/30/2020 11:00 AM EDT ----- LIpids not done in this lab panel Would set up for lipid panel when he comes in for other testing

## 2020-08-06 ENCOUNTER — Ambulatory Visit (HOSPITAL_COMMUNITY)
Admission: RE | Admit: 2020-08-06 | Discharge: 2020-08-06 | Disposition: A | Discharge status: Home / Self Care | Payer: Medicare PPO | Source: Ambulatory Visit | Attending: Internal Medicine | Admitting: Internal Medicine

## 2020-08-06 ENCOUNTER — Other Ambulatory Visit: Payer: Self-pay

## 2020-08-06 ENCOUNTER — Other Ambulatory Visit (HOSPITAL_COMMUNITY)
Admission: RE | Admit: 2020-08-06 | Discharge: 2020-08-06 | Disposition: A | Discharge status: Home / Self Care | Payer: Medicare PPO | Source: Ambulatory Visit | Attending: Internal Medicine | Admitting: Internal Medicine

## 2020-08-06 ENCOUNTER — Encounter (HOSPITAL_COMMUNITY): Payer: Self-pay

## 2020-08-06 ENCOUNTER — Encounter (HOSPITAL_COMMUNITY)
Admission: RE | Admit: 2020-08-06 | Discharge: 2020-08-06 | Disposition: A | Discharge status: Home / Self Care | Payer: Medicare PPO | Source: Ambulatory Visit | Attending: Internal Medicine | Admitting: Internal Medicine

## 2020-08-06 DIAGNOSIS — R55 Syncope and collapse: Secondary | ICD-10-CM

## 2020-08-06 DIAGNOSIS — I6523 Occlusion and stenosis of bilateral carotid arteries: Secondary | ICD-10-CM | POA: Diagnosis not present

## 2020-08-06 DIAGNOSIS — Z951 Presence of aortocoronary bypass graft: Secondary | ICD-10-CM | POA: Diagnosis not present

## 2020-08-06 DIAGNOSIS — Z13220 Encounter for screening for lipoid disorders: Secondary | ICD-10-CM

## 2020-08-06 DIAGNOSIS — I2510 Atherosclerotic heart disease of native coronary artery without angina pectoris: Secondary | ICD-10-CM

## 2020-08-06 HISTORY — DX: Disorder of kidney and ureter, unspecified: N28.9

## 2020-08-06 HISTORY — DX: Essential (primary) hypertension: I10

## 2020-08-06 HISTORY — DX: Malignant (primary) neoplasm, unspecified: C80.1

## 2020-08-06 LAB — LIPID PANEL
Cholesterol: 154 mg/dL (ref 0–200)
HDL: 53 mg/dL (ref >40)
LDL Cholesterol: 88 mg/dL (ref 0–99)
Total CHOL/HDL Ratio: 2.9 RATIO
Triglycerides: 66 mg/dL (ref <150)
VLDL: 13 mg/dL (ref 0–40)

## 2020-08-06 LAB — NM MYOCAR MULTI W/SPECT W/WALL MOTION / EF
LV dias vol: 87 mL (ref 62–150)
LV sys vol: 37 mL
Peak HR: 72 {beats}/min
RATE: 0.34
Rest HR: 52 {beats}/min
SDS: 2
SRS: 0
SSS: 2
TID: 1.04

## 2020-08-06 MED ORDER — SODIUM CHLORIDE FLUSH 0.9 % IV SOLN
INTRAVENOUS | Status: AC
  Administered 2020-08-06: 10 mL via INTRAVENOUS
  Filled 2020-08-06: qty 10

## 2020-08-06 MED ORDER — TECHNETIUM TC 99M TETROFOSMIN IV KIT
10.0000 | PACK | Freq: Once | INTRAVENOUS | Status: AC | PRN
  Administered 2020-08-06: 11 via INTRAVENOUS

## 2020-08-06 MED ORDER — REGADENOSON 0.4 MG/5ML IV SOLN
INTRAVENOUS | Status: AC
  Filled 2020-08-06: qty 5

## 2020-08-06 MED ORDER — TECHNETIUM TC 99M TETROFOSMIN IV KIT
30.0000 | PACK | Freq: Once | INTRAVENOUS | Status: AC | PRN
  Administered 2020-08-06: 31 via INTRAVENOUS

## 2020-08-09 ENCOUNTER — Other Ambulatory Visit: Payer: Self-pay

## 2020-08-09 ENCOUNTER — Ambulatory Visit (HOSPITAL_COMMUNITY)
Admission: RE | Admit: 2020-08-09 | Discharge: 2020-08-09 | Disposition: A | Discharge status: Home / Self Care | Payer: Medicare PPO | Source: Ambulatory Visit | Attending: Internal Medicine | Admitting: Internal Medicine

## 2020-08-09 ENCOUNTER — Telehealth: Payer: Self-pay | Admitting: Internal Medicine

## 2020-08-09 DIAGNOSIS — R55 Syncope and collapse: Secondary | ICD-10-CM

## 2020-08-09 LAB — ECHOCARDIOGRAM COMPLETE
AR max vel: 2.64 cm2
AV Area VTI: 2.87 cm2
AV Area mean vel: 2.74 cm2
AV Mean grad: 3 mmHg
AV Peak grad: 5.7 mmHg
Ao pk vel: 1.19 m/s
Area-P 1/2: 2.57 cm2
S' Lateral: 2.5 cm

## 2020-08-09 NOTE — Telephone Encounter (Signed)
Results discussed with daughter, he will have echo today.

## 2020-08-09 NOTE — Progress Notes (Signed)
  Echocardiogram 2D Echocardiogram has been performed.  Brett Leonard 08/09/2020, 3:42 PM

## 2020-08-09 NOTE — Telephone Encounter (Signed)
New message    Patient daughter Freddie Breech is returning call to [facility] for results

## 2020-08-16 ENCOUNTER — Telehealth: Payer: Self-pay | Admitting: *Deleted

## 2020-08-16 DIAGNOSIS — Z13220 Encounter for screening for lipoid disorders: Secondary | ICD-10-CM

## 2020-08-16 MED ORDER — PRAVASTATIN SODIUM 80 MG PO TABS: 80.0000 mg | ORAL_TABLET | Freq: Every evening | ORAL | 3 refills | Status: DC

## 2020-08-16 NOTE — Telephone Encounter (Signed)
-----   Message from [name], RN sent at 08/16/2020  1:14 PM EDT ----- Brett Leonard pt.

## 2020-08-30 ENCOUNTER — Telehealth: Payer: Self-pay | Admitting: Internal Medicine

## 2020-08-30 MED ORDER — PRAVASTATIN SODIUM 80 MG PO TABS: 80.0000 mg | ORAL_TABLET | Freq: Every evening | ORAL | 3 refills | Status: DC

## 2020-08-30 NOTE — Telephone Encounter (Signed)
  *  STAT* If patient is at the pharmacy, call can be transferred to refill team.   1. Which medications need to be refilled? (please list name of each medication and dose if known) pravastatin (PRAVACHOL) 80 MG tablet  2. Which pharmacy/location (including street and city if local pharmacy) is medication to be sent to? [place], [place]  3. Do they need a 30 day or 90 day supply? 90 days   Per pharmacy they did not receive refill request, please resend refill

## 2020-08-30 NOTE — Telephone Encounter (Signed)
Medication was filled 08/16/20 #90 with 3 refills. Called pt to verify correct pharmacy. Left a message for pt to call office back.

## 2020-09-02 DIAGNOSIS — E7801 Familial hypercholesterolemia: Secondary | ICD-10-CM | POA: Diagnosis not present

## 2020-09-02 DIAGNOSIS — Z8546 Personal history of malignant neoplasm of prostate: Secondary | ICD-10-CM | POA: Diagnosis not present

## 2020-09-02 DIAGNOSIS — E7800 Pure hypercholesterolemia, unspecified: Secondary | ICD-10-CM | POA: Diagnosis not present

## 2020-09-02 DIAGNOSIS — E785 Hyperlipidemia, unspecified: Secondary | ICD-10-CM | POA: Diagnosis not present

## 2020-09-02 DIAGNOSIS — Z8673 Personal history of transient ischemic attack (TIA), and cerebral infarction without residual deficits: Secondary | ICD-10-CM | POA: Diagnosis not present

## 2020-09-05 DIAGNOSIS — H6120 Impacted cerumen, unspecified ear: Secondary | ICD-10-CM | POA: Diagnosis not present

## 2020-09-05 DIAGNOSIS — Z0001 Encounter for general adult medical examination with abnormal findings: Secondary | ICD-10-CM | POA: Diagnosis not present

## 2020-09-05 DIAGNOSIS — E785 Hyperlipidemia, unspecified: Secondary | ICD-10-CM | POA: Diagnosis not present

## 2020-09-05 DIAGNOSIS — Z23 Encounter for immunization: Secondary | ICD-10-CM | POA: Diagnosis not present

## 2020-09-05 DIAGNOSIS — Z87898 Personal history of other specified conditions: Secondary | ICD-10-CM | POA: Diagnosis not present

## 2020-09-05 DIAGNOSIS — R002 Palpitations: Secondary | ICD-10-CM | POA: Diagnosis not present

## 2020-09-05 DIAGNOSIS — I25119 Atherosclerotic heart disease of native coronary artery with unspecified angina pectoris: Secondary | ICD-10-CM | POA: Diagnosis not present

## 2020-09-05 DIAGNOSIS — N400 Enlarged prostate without lower urinary tract symptoms: Secondary | ICD-10-CM | POA: Diagnosis not present

## 2020-09-11 ENCOUNTER — Telehealth: Payer: Self-pay | Admitting: Internal Medicine

## 2020-09-11 NOTE — Telephone Encounter (Signed)
-----   Message from Berlinda Last, [place] sent at 09/10/2020  8:34 AM EDT ----- Hey Dr. Harrington Challenger. Mr. Barger daughter called [place] yesterday upset because her fathers PCP saw the Carotid Duplex that you had ordered on Mr. Khanna and decided that it was best he have surgery for his blockage. She wants to know do you believe that is necessary.   Genia Del., CMA

## 2020-09-11 NOTE — Telephone Encounter (Signed)
Left a message for pt's daughter to call office back regarding [place]

## 2020-09-11 NOTE — Telephone Encounter (Signed)
I did not receive results of carotid USN initially I have reviewed   he has moderate plaquing of carotid arteries   Vertebral arteries have normal flow   I do not see anything that warrants surgery (? Misunderstanding)   Radiologist did mention possibly ordering a CT to evaluate further   BUT based on numbers/findings I do not think it is needed.    Keep up with control of BP and cholesterol    None of test results explaing passing out spell

## 2020-09-18 NOTE — Addendum Note (Signed)
Addended by: Levonne Hubert on: 09/18/2020 12:53 PM   Modules accepted: Orders

## 2020-09-19 DIAGNOSIS — R55 Syncope and collapse: Secondary | ICD-10-CM | POA: Diagnosis not present

## 2020-09-27 ENCOUNTER — Ambulatory Visit: Payer: BC Managed Care – PPO | Admitting: Internal Medicine

## 2020-10-17 DIAGNOSIS — F17210 Nicotine dependence, cigarettes, uncomplicated: Secondary | ICD-10-CM | POA: Diagnosis not present

## 2020-10-17 DIAGNOSIS — E785 Hyperlipidemia, unspecified: Secondary | ICD-10-CM | POA: Diagnosis not present

## 2020-10-17 DIAGNOSIS — I25119 Atherosclerotic heart disease of native coronary artery with unspecified angina pectoris: Secondary | ICD-10-CM | POA: Diagnosis not present

## 2020-10-17 DIAGNOSIS — R5383 Other fatigue: Secondary | ICD-10-CM | POA: Diagnosis not present

## 2020-10-17 DIAGNOSIS — R002 Palpitations: Secondary | ICD-10-CM | POA: Diagnosis not present

## 2020-10-17 DIAGNOSIS — Z0001 Encounter for general adult medical examination with abnormal findings: Secondary | ICD-10-CM | POA: Diagnosis not present

## 2020-10-17 DIAGNOSIS — Z6820 Body mass index (BMI) 20.0-20.9, adult: Secondary | ICD-10-CM | POA: Diagnosis not present
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
## 2020-12-24 ENCOUNTER — Ambulatory Visit: Payer: Medicare PPO | Admitting: Internal Medicine

## 2020-12-24 ENCOUNTER — Encounter: Payer: Self-pay | Admitting: Internal Medicine

## 2020-12-24 ENCOUNTER — Other Ambulatory Visit: Payer: Self-pay

## 2020-12-24 VITALS — BP 120/72 | HR 61 | Ht 67.0 in | Wt 133.6 lb

## 2020-12-24 DIAGNOSIS — R55 Syncope and collapse: Secondary | ICD-10-CM | POA: Diagnosis not present

## 2020-12-24 MED ORDER — ROSUVASTATIN CALCIUM 20 MG PO TABS: 20.0000 mg | ORAL_TABLET | Freq: Every day | ORAL | 3 refills | Status: DC

## 2020-12-24 MED ORDER — POTASSIUM CHLORIDE ER 10 MEQ PO TBCR: 10.0000 meq | EXTENDED_RELEASE_TABLET | ORAL | 3 refills | Status: DC

## 2020-12-24 MED ORDER — FUROSEMIDE 40 MG PO TABS: 40.0000 mg | ORAL_TABLET | ORAL | 3 refills | Status: DC

## 2020-12-24 NOTE — Patient Instructions (Addendum)
Medication Instructions:  Your physician has recommended you make the following change in your medication:  STOP Pravastatin  START Crestor 20 mg tablets daily START Lasix 40 mg tablets once weekly  START Potassium 10 mEq once weekly with Lasix  *If you need a refill on your cardiac medications before your next appointment, please call your pharmacy*   Lab Work: [place]- BMET/BNP IN TWO MONTHS- LIPID/CMET/BNP If you have labs (blood work) drawn today and your tests are completely normal, you will receive your results only by: [place] (if you have MyChart) OR A paper copy in the mail If you have any lab test that is abnormal or we need to change your treatment, we will call you to review the results.   Testing/Procedures: None    Follow-Up: At [facility], you and your health needs are our priority.  As part of our continuing mission to provide you with exceptional heart care, we have created designated Provider Care Teams.  These Care Teams include your primary Cardiologist (physician) and Advanced Practice Providers (APPs -  Physician Assistants and Nurse Practitioners) who all work together to provide you with the care you need, when you need it.  We recommend signing up for the patient portal called "MyChart".  Sign up information is provided on this After Visit Summary.  MyChart is used to connect with patients for Virtual Visits (Telemedicine).  Patients are able to view lab/test results, encounter notes, upcoming appointments, etc.  Non-urgent messages can be sent to your provider as well.   To learn more about what you can do with MyChart, go to [email or web address].    Your next appointment:   May 2023  The format for your next appointment:   In Person  Provider:   Dorris Carnes, MD    Other Instructions
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
## 2021-05-07 ENCOUNTER — Ambulatory Visit (INDEPENDENT_AMBULATORY_CARE_PROVIDER_SITE_OTHER): Payer: Medicare PPO

## 2021-05-07 ENCOUNTER — Ambulatory Visit: Payer: Medicare PPO | Admitting: Internal Medicine

## 2021-05-07 ENCOUNTER — Encounter: Payer: Self-pay | Admitting: Internal Medicine

## 2021-05-07 VITALS — BP 100/70 | HR 62 | Ht 67.0 in | Wt 131.4 lb

## 2021-05-07 DIAGNOSIS — Z79899 Other long term (current) drug therapy: Secondary | ICD-10-CM

## 2021-05-07 DIAGNOSIS — R55 Syncope and collapse: Secondary | ICD-10-CM

## 2021-05-07 NOTE — Progress Notes (Unsigned)
Applied a 14 day Zio XT monitor to patient in the office ?

## 2021-05-07 NOTE — Patient Instructions (Addendum)
Medication Instructions:  ? ?*If you need a refill on your cardiac medications before your next appointment, please call your pharmacy* ? ? ?Lab Work: ?CMET, PRO BNP, CBC, TSH ? ?If you have labs (blood work) drawn today and your tests are completely normal, you will receive your results only by: ?MyChart Message (if you have MyChart) OR ?A paper copy in the mail ?If you have any lab test that is abnormal or we need to change your treatment, we will call you to review the results. ? ? ?Testing/Procedures: ?ZIO XT- Long Term Monitor Instructions ? ?Your physician has requested you wear a ZIO patch monitor for 14 days.  ?This is a single patch monitor. [employer] supplies one patch monitor per enrollment. Additional ?stickers are not available. Please do not apply patch if you will be having a Nuclear Stress Test,  ?Echocardiogram, Cardiac CT, MRI, or Chest Xray during the period you would be wearing the  ?monitor. The patch cannot be worn during these tests. You cannot remove and re-apply the  ?ZIO XT patch monitor.  ?Your ZIO patch monitor will be mailed 3 day USPS to your address on file. It may take 3-5 days  ?to receive your monitor after you have been enrolled.  ?Once you have received your monitor, please review the enclosed instructions. Your monitor  ?has already been registered assigning a specific monitor serial # to you. ? ?Billing and Patient Assistance Program Information ? ?We have supplied [employer] with any of your insurance information on file for billing purposes. ?[employer] offers a sliding scale Patient Assistance Program for patients that do not have  ?insurance, or whose insurance does not completely cover the cost of the ZIO monitor.  ?You must apply for the Patient Assistance Program to qualify for this discounted rate.  ?To apply, please call [employer] at [phone], select option 4, select option 2, ask to apply for  ?Patient Assistance Program. Theodore Demark will ask your household income, and how many  people  ?are in your household. They will quote your out-of-pocket cost based on that information.  ?[employer] will also be able to set up a 22-month interest-free payment plan if needed. ? ?Applying the monitor ?  ?Shave hair from upper left chest.  ?Hold abrader disc by orange tab. Rub abrader in 40 strokes over the upper left chest as  ?indicated in your monitor instructions.  ?Clean area with 4 enclosed alcohol pads. Let dry.  ?Apply patch as indicated in monitor instructions. Patch will be placed under collarbone on left  ?side of chest with arrow pointing upward.  ?Rub patch adhesive wings for 2 minutes. Remove white label marked "1". Remove the white  ?label marked "2". Rub patch adhesive wings for 2 additional minutes.  ?While looking in a mirror, press and release button in center of patch. A small green light will  ?flash 3-4 times. This will be your only indicator that the monitor has been turned on.  ?Do not shower for the first 24 hours. You may shower after the first 24 hours.  ?Press the button if you feel a symptom. You will hear a small click. Record Date, Time and  ?Symptom in the Patient Logbook.  ?When you are ready to remove the patch, follow instructions on the last 2 pages of Patient  ?Logbook. Stick patch monitor onto the last page of Patient Logbook.  ?Place Patient Logbook in the blue and white box. Use locking tab on box and tape box closed  ?securely. The blue  and white box has prepaid postage on it. Please place it in the mailbox as  ?soon as possible. Your physician should have your test results approximately 7 days after the  ?monitor has been mailed back to [facility].  ?Call [facility] at [phone] if you have questions regarding  ?your ZIO XT patch monitor. Call them immediately if you see an orange light blinking on your  ?monitor.  ?If your monitor falls off in less than 4 days, contact our Monitor department at [phone].  ?If your monitor becomes  loose or falls off after 4 days call [employer] at [phone] for  ?suggestions on securing your monitor ? ? ? ?Follow-Up: ?At [facility], you and your health needs are our priority.  As part of our continuing mission to provide you with exceptional heart care, we have created designated Provider Care Teams.  These Care Teams include your primary Cardiologist (physician) and Advanced Practice Providers (APPs -  Physician Assistants and Nurse Practitioners) who all work together to provide you with the care you need, when you need it. ? ?We recommend signing up for the patient portal called "MyChart".  Sign up information is provided on this After Visit Summary.  MyChart is used to connect with patients for Virtual Visits (Telemedicine).  Patients are able to view lab/test results, encounter notes, upcoming appointments, etc.  Non-urgent messages can be sent to your provider as well.   ?To learn more about what you can do with MyChart, go to [email or web address].   ? ? ?Important Information About [place] ? ? ? ? ?  ?

## 2021-05-08 ENCOUNTER — Other Ambulatory Visit: Payer: Self-pay

## 2021-05-08 LAB — COMPREHENSIVE METABOLIC PANEL
ALT: 13 IU/L (ref 0–44)
AST: 29 IU/L (ref 0–40)
Albumin/Globulin Ratio: 1.8 (ref 1.2–2.2)
Albumin: 4.4 g/dL (ref 3.6–4.6)
Alkaline Phosphatase: 64 IU/L (ref 44–121)
BUN/Creatinine Ratio: 14 (ref 10–24)
BUN: 15 mg/dL (ref 8–27)
Bilirubin Total: 0.6 mg/dL (ref 0.0–1.2)
CO2: 21 mmol/L (ref 20–29)
Calcium: 9.6 mg/dL (ref 8.6–10.2)
Chloride: 104 mmol/L (ref 96–106)
Creatinine, Ser: 1.09 mg/dL (ref 0.76–1.27)
Globulin, Total: 2.4 g/dL (ref 1.5–4.5)
Glucose: 94 mg/dL (ref 70–99)
Potassium: 4.3 mmol/L (ref 3.5–5.2)
Sodium: 142 mmol/L (ref 134–144)
Total Protein: 6.8 g/dL (ref 6.0–8.5)
eGFR: 66 mL/min/{1.73_m2} (ref >59)

## 2021-05-08 LAB — CBC
Hematocrit: 37.5 % (ref 37.5–51.0)
Hemoglobin: 13 g/dL (ref 13.0–17.7)
MCH: 30.2 pg (ref 26.6–33.0)
MCHC: 34.7 g/dL (ref 31.5–35.7)
MCV: 87 fL (ref 79–97)
Platelets: 189 10*3/uL (ref 150–450)
RBC: 4.3 x10E6/uL (ref 4.14–5.80)
RDW: 13.6 % (ref 11.6–15.4)
WBC: 7.7 10*3/uL (ref 3.4–10.8)

## 2021-05-08 LAB — TSH: TSH: 4.22 u[IU]/mL (ref 0.450–4.500)

## 2021-05-08 LAB — PRO B NATRIURETIC PEPTIDE: NT-Pro BNP: 1883 pg/mL — ABNORMAL HIGH (ref 0–486)

## 2021-05-08 MED ORDER — FUROSEMIDE 40 MG PO TABS: 40.0000 mg | ORAL_TABLET | ORAL | 3 refills | Status: DC

## 2021-05-08 NOTE — Telephone Encounter (Signed)
Pt's medication was sent to pt's pharmacy as requested. Confirmation received.  °

## 2021-05-13 ENCOUNTER — Telehealth: Payer: Self-pay | Admitting: Internal Medicine

## 2021-05-13 MED ORDER — POTASSIUM CHLORIDE ER 10 MEQ PO TBCR: 10.0000 meq | EXTENDED_RELEASE_TABLET | ORAL | 3 refills | Status: DC

## 2021-05-13 NOTE — Telephone Encounter (Signed)
Completed.

## 2021-05-13 NOTE — Telephone Encounter (Signed)
Pt c/o medication issue: ? ?1. Name of Medication: potassium chloride (KLOR-CON) 10 MEQ tablet (Expired) ? ?2. How are you currently taking this medication (dosage and times per day)? N/a ? ?3. Are you having a reaction (difficulty breathing--STAT)? no ? ?4. What is your medication issue? Patient wants to know if he is supposed to be taking this medication. If so, please send refill to [place], [place] ? ?Patient states that he keeps losing connection with [place] and if we lose connection with him to please call him back.   ?

## 2021-05-14 ENCOUNTER — Telehealth: Payer: Self-pay

## 2021-05-14 NOTE — Telephone Encounter (Signed)
-----   Message from Fay Records, MD sent at 05/07/2021  8:46 PM EDT ----- ?Please see why patient is not on statin   Hx of CAD and CV dz    I had switched to Crestor   Should be on it ?

## 2021-05-14 NOTE — Telephone Encounter (Signed)
Left a message for the pt to call back.  

## 2021-05-30 DIAGNOSIS — R55 Syncope and collapse: Secondary | ICD-10-CM | POA: Diagnosis not present

## 2021-06-10 NOTE — Telephone Encounter (Signed)
Left a message for the pt to call back.  

## 2021-06-26 NOTE — Telephone Encounter (Signed)
Spoke with the pts daughter Henriette Combs and she reports that the pt has been taking the Crestor daily but she says she will double check to be sure... she says [place] and another family member brought him to his last appt and they must have misunderstood when noted he was not taking it... if she finds differently she will send [place] a My Chart and let us know.

## 2021-09-06 DIAGNOSIS — E7801 Familial hypercholesterolemia: Secondary | ICD-10-CM | POA: Diagnosis not present

## 2021-09-06 DIAGNOSIS — M791 Myalgia, unspecified site: Secondary | ICD-10-CM | POA: Diagnosis not present

## 2021-09-06 DIAGNOSIS — E785 Hyperlipidemia, unspecified: Secondary | ICD-10-CM | POA: Diagnosis not present

## 2021-09-06 DIAGNOSIS — E7800 Pure hypercholesterolemia, unspecified: Secondary | ICD-10-CM | POA: Diagnosis not present

## 2021-09-06 DIAGNOSIS — R5383 Other fatigue: Secondary | ICD-10-CM | POA: Diagnosis not present

## 2021-09-09 DIAGNOSIS — Z0001 Encounter for general adult medical examination with abnormal findings: Secondary | ICD-10-CM | POA: Diagnosis not present

## 2021-09-09 DIAGNOSIS — E785 Hyperlipidemia, unspecified: Secondary | ICD-10-CM | POA: Diagnosis not present

## 2021-09-09 DIAGNOSIS — R030 Elevated blood-pressure reading, without diagnosis of hypertension: Secondary | ICD-10-CM | POA: Diagnosis not present

## 2021-09-09 DIAGNOSIS — R002 Palpitations: Secondary | ICD-10-CM | POA: Diagnosis not present

## 2021-09-09 DIAGNOSIS — J302 Other seasonal allergic rhinitis: Secondary | ICD-10-CM | POA: Diagnosis not present

## 2021-09-09 DIAGNOSIS — Z87898 Personal history of other specified conditions: Secondary | ICD-10-CM | POA: Diagnosis not present

## 2021-09-09 DIAGNOSIS — Z681 Body mass index (BMI) 19 or less, adult: Secondary | ICD-10-CM | POA: Diagnosis not present

## 2021-09-09 DIAGNOSIS — N400 Enlarged prostate without lower urinary tract symptoms: Secondary | ICD-10-CM | POA: Diagnosis not present

## 2021-09-09 DIAGNOSIS — I25119 Atherosclerotic heart disease of native coronary artery with unspecified angina pectoris: Secondary | ICD-10-CM | POA: Diagnosis not present

## 2021-10-14 DIAGNOSIS — R5383 Other fatigue: Secondary | ICD-10-CM | POA: Diagnosis not present

## 2021-10-14 DIAGNOSIS — R7989 Other specified abnormal findings of blood chemistry: Secondary | ICD-10-CM | POA: Diagnosis not present

## 2021-10-20 DIAGNOSIS — Z23 Encounter for immunization: Secondary | ICD-10-CM | POA: Diagnosis not present

## 2021-10-20 DIAGNOSIS — E785 Hyperlipidemia, unspecified: Secondary | ICD-10-CM | POA: Diagnosis not present

## 2021-10-20 DIAGNOSIS — Z0000 Encounter for general adult medical examination without abnormal findings: Secondary | ICD-10-CM | POA: Diagnosis not present

## 2021-10-20 DIAGNOSIS — Z6820 Body mass index (BMI) 20.0-20.9, adult: Secondary | ICD-10-CM | POA: Diagnosis not present

## 2021-12-29 ENCOUNTER — Other Ambulatory Visit: Payer: Self-pay | Admitting: Internal Medicine

## 2021-12-29 NOTE — Telephone Encounter (Signed)
Rx refill sent to pharmacy. 

## 2022-01-22 ENCOUNTER — Telehealth: Payer: Self-pay | Admitting: *Deleted

## 2022-01-22 NOTE — Telephone Encounter (Signed)
   Pre-operative Risk Assessment    Patient Name: Brett Leonard  DOB: Mar 05, 1931 MRN: [id number]     Request for Surgical Clearance    Procedure:  Dental Extraction - Amount of Teeth to be Pulled:  1 TOOTH FOR EXTRACTION  Date of Surgery:  Clearance TBD                                 Surgeon:  DR. MARK HILL, DDS Surgeon's Group or Practice Name:  [facility] D[facility], [facility] Phone number:  [phone] Fax number:  [phone]   Type of Clearance Requested:   - Medical ; ASA    Type of Anesthesia:  Local    Additional requests/questions:    Jiles Prows   01/22/2022, 5:54 PM

## 2022-01-23 NOTE — Telephone Encounter (Signed)
   Primary Cardiologist: None  Chart reviewed as part of pre-operative protocol coverage. Simple dental extractions (1-2 teeth) are considered low risk procedures per guidelines and generally do not require any specific cardiac clearance. It is also generally accepted that for simple extractions and dental cleanings, there is no need to interrupt blood thinner therapy.   SBE prophylaxis is not required for the patient.  I will route this recommendation to the requesting party via Epic fax function and remove from pre-op pool.  Please call with questions.  Emmaline Life, NP-C  01/23/2022, 8:48 AM [address] Office [phone] Fax [phone]

## 2022-02-26 DIAGNOSIS — R030 Elevated blood-pressure reading, without diagnosis of hypertension: Secondary | ICD-10-CM | POA: Diagnosis not present

## 2022-02-26 DIAGNOSIS — Z6820 Body mass index (BMI) 20.0-20.9, adult: Secondary | ICD-10-CM | POA: Diagnosis not present

## 2022-02-26 DIAGNOSIS — K0889 Other specified disorders of teeth and supporting structures: Secondary | ICD-10-CM | POA: Diagnosis not present

## 2022-03-25 ENCOUNTER — Other Ambulatory Visit: Payer: Self-pay | Admitting: Internal Medicine

## 2022-03-26 ENCOUNTER — Other Ambulatory Visit: Payer: Self-pay | Admitting: Internal Medicine

## 2022-03-26 MED ORDER — POTASSIUM CHLORIDE ER 10 MEQ PO TBCR: 10.0000 meq | EXTENDED_RELEASE_TABLET | ORAL | 0 refills | Status: DC

## 2022-04-28 ENCOUNTER — Other Ambulatory Visit: Payer: Self-pay | Admitting: Internal Medicine

## 2022-05-28 ENCOUNTER — Other Ambulatory Visit: Payer: Self-pay | Admitting: Internal Medicine

## 2022-07-01 ENCOUNTER — Other Ambulatory Visit: Payer: Self-pay | Admitting: Internal Medicine

## 2022-07-30 ENCOUNTER — Other Ambulatory Visit: Payer: Self-pay | Admitting: Internal Medicine

## 2022-08-27 ENCOUNTER — Other Ambulatory Visit: Payer: Self-pay | Admitting: Internal Medicine

## 2022-09-07 ENCOUNTER — Other Ambulatory Visit: Payer: Self-pay | Admitting: Internal Medicine

## 2022-09-11 DIAGNOSIS — M47816 Spondylosis without myelopathy or radiculopathy, lumbar region: Secondary | ICD-10-CM | POA: Diagnosis not present

## 2022-09-11 DIAGNOSIS — E785 Hyperlipidemia, unspecified: Secondary | ICD-10-CM | POA: Diagnosis not present

## 2022-09-11 DIAGNOSIS — M4854XA Collapsed vertebra, not elsewhere classified, thoracic region, initial encounter for fracture: Secondary | ICD-10-CM | POA: Diagnosis not present

## 2022-09-11 DIAGNOSIS — I700 Atherosclerosis of aorta: Secondary | ICD-10-CM | POA: Diagnosis not present

## 2022-09-11 DIAGNOSIS — R109 Unspecified abdominal pain: Secondary | ICD-10-CM | POA: Diagnosis not present

## 2022-09-11 DIAGNOSIS — Z681 Body mass index (BMI) 19 or less, adult: Secondary | ICD-10-CM | POA: Diagnosis not present

## 2022-09-11 DIAGNOSIS — E7801 Familial hypercholesterolemia: Secondary | ICD-10-CM | POA: Diagnosis not present

## 2022-09-11 DIAGNOSIS — M4856XA Collapsed vertebra, not elsewhere classified, lumbar region, initial encounter for fracture: Secondary | ICD-10-CM | POA: Diagnosis not present

## 2022-09-11 DIAGNOSIS — R7989 Other specified abnormal findings of blood chemistry: Secondary | ICD-10-CM | POA: Diagnosis not present

## 2022-09-11 DIAGNOSIS — K449 Diaphragmatic hernia without obstruction or gangrene: Secondary | ICD-10-CM | POA: Diagnosis not present

## 2022-09-11 DIAGNOSIS — M549 Dorsalgia, unspecified: Secondary | ICD-10-CM | POA: Diagnosis not present

## 2022-09-11 DIAGNOSIS — Z1329 Encounter for screening for other suspected endocrine disorder: Secondary | ICD-10-CM | POA: Diagnosis not present

## 2022-09-11 DIAGNOSIS — K5730 Diverticulosis of large intestine without perforation or abscess without bleeding: Secondary | ICD-10-CM | POA: Diagnosis not present

## 2022-09-11 DIAGNOSIS — Z131 Encounter for screening for diabetes mellitus: Secondary | ICD-10-CM | POA: Diagnosis not present

## 2022-09-11 DIAGNOSIS — E559 Vitamin D deficiency, unspecified: Secondary | ICD-10-CM | POA: Diagnosis not present

## 2022-09-11 DIAGNOSIS — K8689 Other specified diseases of pancreas: Secondary | ICD-10-CM | POA: Diagnosis not present

## 2022-09-11 DIAGNOSIS — R29890 Loss of height: Secondary | ICD-10-CM | POA: Diagnosis not present

## 2022-09-11 DIAGNOSIS — Z0000 Encounter for general adult medical examination without abnormal findings: Secondary | ICD-10-CM | POA: Diagnosis not present

## 2022-09-15 DIAGNOSIS — J302 Other seasonal allergic rhinitis: Secondary | ICD-10-CM | POA: Diagnosis not present

## 2022-09-15 DIAGNOSIS — Z0001 Encounter for general adult medical examination with abnormal findings: Secondary | ICD-10-CM | POA: Diagnosis not present

## 2022-09-15 DIAGNOSIS — R109 Unspecified abdominal pain: Secondary | ICD-10-CM | POA: Diagnosis not present

## 2022-09-15 DIAGNOSIS — E785 Hyperlipidemia, unspecified: Secondary | ICD-10-CM | POA: Diagnosis not present

## 2022-09-15 DIAGNOSIS — M549 Dorsalgia, unspecified: Secondary | ICD-10-CM | POA: Diagnosis not present

## 2022-09-15 DIAGNOSIS — I25119 Atherosclerotic heart disease of native coronary artery with unspecified angina pectoris: Secondary | ICD-10-CM | POA: Diagnosis not present

## 2022-09-15 DIAGNOSIS — N400 Enlarged prostate without lower urinary tract symptoms: Secondary | ICD-10-CM | POA: Diagnosis not present

## 2022-09-15 DIAGNOSIS — R002 Palpitations: Secondary | ICD-10-CM | POA: Diagnosis not present

## 2022-09-15 DIAGNOSIS — Z87898 Personal history of other specified conditions: Secondary | ICD-10-CM | POA: Diagnosis not present

## 2022-09-23 DIAGNOSIS — M47816 Spondylosis without myelopathy or radiculopathy, lumbar region: Secondary | ICD-10-CM | POA: Diagnosis not present

## 2022-10-06 ENCOUNTER — Other Ambulatory Visit: Payer: Self-pay

## 2022-10-06 ENCOUNTER — Ambulatory Visit: Payer: Medicare PPO | Attending: Neurological Surgery

## 2022-10-06 DIAGNOSIS — M5459 Other low back pain: Secondary | ICD-10-CM | POA: Diagnosis not present

## 2022-10-06 DIAGNOSIS — R262 Difficulty in walking, not elsewhere classified: Secondary | ICD-10-CM | POA: Insufficient documentation

## 2022-10-06 DIAGNOSIS — R293 Abnormal posture: Secondary | ICD-10-CM | POA: Diagnosis not present

## 2022-10-06 NOTE — Therapy (Signed)
OUTPATIENT PHYSICAL THERAPY THORACOLUMBAR EVALUATION   Patient Name: Brett Leonard MRN: [id number] DOB:[birth year], 89 y.o., male Today's Date: 10/06/2022  END OF SESSION:  PT End of Session - 10/06/22 0923     Visit Number 1    Number of Visits 10    Date for PT Re-Evaluation 12/11/22    PT Start Time 0930    PT Stop Time 1006    PT Time Calculation (min) 36 min    Activity Tolerance Patient tolerated treatment well    Behavior During Therapy WFL for tasks assessed/performed             Past Medical History:  Diagnosis Date   Body mass index (BMI) of 20.0-20.9 in adult    Cancer [facility])    Prostate   Hypertension    Palpitations    Renal insufficiency    Past Surgical History:  Procedure Laterality Date   CORONARY ARTERY BYPASS GRAFT  10/24/2009   Patient Active Problem List   Diagnosis Date Noted   DYSLIPIDEMIA 11/26/2009   CAD 11/26/2009   CEREBRAL INFARCTION 11/26/2009   PLEURAL EFFUSION, LEFT 11/26/2009   REFERRING PROVIDER: Bethann Goo, DO   REFERRING DIAG: Spondylosis without myelopathy or radiculopathy, lumbar region   Rationale for Evaluation and Treatment: Rehabilitation  THERAPY DIAG:  Other low back pain  Abnormal posture  Difficulty in walking, not elsewhere classified  ONSET DATE: "a couple weeks ago"  SUBJECTIVE:                                                                                                                                                                                           SUBJECTIVE STATEMENT: Patient reports that his back started bothering him a couple weeks ago with no known cause. He notes that it has been gradually getting worse. He notes that it is intermittent pain on his left side, but it can go around to his right. He had never had pain like this previously.   PERTINENT HISTORY:  History of a cerebral infarction, history of prostate cancer, and hypertension  PAIN:  Are you having pain? Yes: NPRS  scale: 10/10 Pain location: low back (L>R) Pain description: intermittent sharp pain Aggravating factors: none known Relieving factors: medication, laying supine  PRECAUTIONS: None  RED FLAGS: None   WEIGHT BEARING RESTRICTIONS: No  FALLS:  Has patient fallen in last 6 months? No  LIVING ENVIRONMENT: Lives with: lives alone Lives in: House/apartment Stairs: No Has following equipment at home: Single point cane and Ramped entry  OCCUPATION: retired  PLOF: Independent  PATIENT GOALS: reduced pain and be able to do yard work  around his house   NEXT MD VISIT: none scheduled  OBJECTIVE:   PATIENT SURVEYS:  FOTO to be assessed at first follow up  SCREENING FOR RED FLAGS: Bowel or bladder incontinence: No Spinal tumors: No Cauda equina syndrome: No Compression fracture: No Abdominal aneurysm: No  COGNITION: Overall cognitive status: Within functional limits for tasks assessed     SENSATION: Patient reports no numbness or tingling  POSTURE: rounded shoulders, forward head, decreased lumbar lordosis, and increased thoracic kyphosis  PALPATION: TTP: left QL (minimal)   JOINT MOBILITY:  Hypomobile and nonpainful throughout lumbar spine  LUMBAR ROM:   AROM eval  Flexion 40; painful with flexion and returning to neutral  Extension 5  Right lateral flexion   Left lateral flexion   Right rotation 25% limited  Left rotation 50% limited   (Blank rows = not tested)  LOWER EXTREMITY ROM: WFL for activities assessed  LOWER EXTREMITY MMT:    MMT Right eval Left eval  Hip flexion 4-/5 3+/5  Hip extension    Hip abduction    Hip adduction    Hip internal rotation    Hip external rotation    Knee flexion 4+/5 4+/5  Knee extension 4/5 4/5  Ankle dorsiflexion 4/5 4/5  Ankle plantarflexion    Ankle inversion    Ankle eversion     (Blank rows = not tested)  FUNCTIONAL TESTS:  Sit to stand: maximal difficulty with upper extremity support  GAIT: Assistive  device utilized: Single point cane Level of assistance: Modified independence Comments: poor foot clearance and decreased step length bilaterally  TODAY'S TREATMENT:                                                                                                                              DATE:     PATIENT EDUCATION:  Education details: Plan of care, prognosis, healing, objective findings, and goals for therapy Person educated: Patient Education method: Explanation Education comprehension: verbalized understanding  HOME EXERCISE PROGRAM:   ASSESSMENT:  CLINICAL IMPRESSION: Patient is a 89 y.o. male who was seen today for physical therapy evaluation and treatment for acute low back pain.  He presented reporting high pain severity and low to moderate irritability as lumbar flexion was the only assessment that reproduced his familiar pain.  Recommend that he continue with skilled physical therapy to address his impairments to return to his prior level of function.  OBJECTIVE IMPAIRMENTS: Abnormal gait, decreased activity tolerance, decreased mobility, difficulty walking, decreased ROM, decreased strength, hypomobility, impaired tone, postural dysfunction, and pain.   ACTIVITY LIMITATIONS: carrying, lifting, bending, standing, transfers, and locomotion level  PARTICIPATION LIMITATIONS: shopping, community activity, and yard work  PERSONAL FACTORS: Age and 3+ comorbidities: History of a cerebral infarction, history of prostate cancer, and hypertension  are also affecting patient's functional outcome.   REHAB POTENTIAL: Good  CLINICAL DECISION MAKING: Evolving/moderate complexity  EVALUATION COMPLEXITY: Moderate   GOALS: Goals reviewed with patient? Yes  SHORT TERM GOALS: Target date: 10/20/22  Patient will be independent with his initial HEP. Baseline: Goal status: INITIAL  2.  Patient will be able to complete his daily activities without his familiar pain exceeding  8/10. Baseline:  Goal status: INITIAL  LONG TERM GOALS: Target date: 11/10/22  Patient will be independent with his advanced HEP. Baseline:  Goal status: INITIAL  2.  Patient will be able to complete his daily activities without his familiar pain exceeding 6/10. Baseline:  Goal status: INITIAL  3.  Patient will report being able to complete his yard work without being limited by his familiar symptoms. Baseline:  Goal status: INITIAL  4.  Patient will be able to transfer from sitting to standing from a standard height chair with minimal difficulty for improved independence. Baseline:  Goal status: INITIAL  5.  Patient will be able to ambulate with improved foot clearance to reduce his risk of tripping and falling for improved household safety. Baseline:  Goal status: INITIAL  PLAN:  PT FREQUENCY: 2x/week  PT DURATION: other: 5 weeks  PLANNED INTERVENTIONS: Therapeutic exercises, Therapeutic activity, Neuromuscular re-education, Balance training, Gait training, Patient/Family education, Self Care, Joint mobilization, Stair training, Dry Needling, Electrical stimulation, Spinal mobilization, Cryotherapy, Moist heat, Traction, Ultrasound, Manual therapy, and Re-evaluation.  PLAN FOR NEXT SESSION: NuStep, postural reeducation, lumbar and lower extremity strengthening, and modalities as needed   Granville Lewis, PT 10/06/2022, 7:08 PM

## 2022-10-13 ENCOUNTER — Ambulatory Visit: Payer: Medicare PPO | Attending: Neurological Surgery

## 2022-10-13 DIAGNOSIS — R293 Abnormal posture: Secondary | ICD-10-CM | POA: Insufficient documentation

## 2022-10-13 DIAGNOSIS — R262 Difficulty in walking, not elsewhere classified: Secondary | ICD-10-CM | POA: Insufficient documentation

## 2022-10-13 DIAGNOSIS — M5459 Other low back pain: Secondary | ICD-10-CM | POA: Diagnosis not present

## 2022-10-13 NOTE — Therapy (Signed)
OUTPATIENT PHYSICAL THERAPY THORACOLUMBAR TREATMENT   Patient Name: Brett Leonard MRN: [id number] DOB:20-Feb-1931, 89 y.o., male Today's Date: 10/13/2022  END OF SESSION:  PT End of Session - 10/13/22 1522     Visit Number 2    Number of Visits 10    Date for PT Re-Evaluation 12/11/22    PT Start Time 1516    PT Stop Time 1600    PT Time Calculation (min) 44 min    Activity Tolerance Patient tolerated treatment well    Behavior During Therapy WFL for tasks assessed/performed              Past Medical History:  Diagnosis Date   Body mass index (BMI) of 20.0-20.9 in adult    Cancer [facility])    Prostate   Hypertension    Palpitations    Renal insufficiency    Past Surgical History:  Procedure Laterality Date   CORONARY ARTERY BYPASS GRAFT  10/24/2009   Patient Active Problem List   Diagnosis Date Noted   DYSLIPIDEMIA 11/26/2009   Coronary atherosclerosis 11/26/2009   Cerebral artery occlusion with cerebral infarction (HCC) 11/26/2009   Pleural effusion 11/26/2009   REFERRING PROVIDER: Dawley, Alan Mulder, DO   REFERRING DIAG: Spondylosis without myelopathy or radiculopathy, lumbar region   Rationale for Evaluation and Treatment: Rehabilitation  THERAPY DIAG:  Other low back pain  Abnormal posture  Difficulty in walking, not elsewhere classified  ONSET DATE: "a couple weeks ago"  SUBJECTIVE:                                                                                                                                                                                           SUBJECTIVE STATEMENT: Patient reports that he is feeling a lot better. He notes that he was able to do some stuff around his house without his back bothering him.  PERTINENT HISTORY:  History of a cerebral infarction, history of prostate cancer, and hypertension  PAIN:  Are you having pain? Yes: NPRS scale: 0/10 Pain location: low back (L>R) Pain description: intermittent sharp  pain Aggravating factors: none known Relieving factors: medication, laying supine  PRECAUTIONS: None  RED FLAGS: None   WEIGHT BEARING RESTRICTIONS: No  FALLS:  Has patient fallen in last 6 months? No  LIVING ENVIRONMENT: Lives with: lives alone Lives in: House/apartment Stairs: No Has following equipment at home: Single point cane and Ramped entry  OCCUPATION: retired  PLOF: Independent  PATIENT GOALS: reduced pain and be able to do yard work around his house   NEXT MD VISIT: none scheduled  OBJECTIVE: all objective measures were assessed at his initial  evaluation on 10/06/22 unless otherwise noted  PATIENT SURVEYS:  FOTO to be assessed at first follow up  SCREENING FOR RED FLAGS: Bowel or bladder incontinence: No Spinal tumors: No Cauda equina syndrome: No Compression fracture: No Abdominal aneurysm: No  COGNITION: Overall cognitive status: Within functional limits for tasks assessed     SENSATION: Patient reports no numbness or tingling  POSTURE: rounded shoulders, forward head, decreased lumbar lordosis, and increased thoracic kyphosis  PALPATION: TTP: left QL (minimal)   JOINT MOBILITY:  Hypomobile and nonpainful throughout lumbar spine  LUMBAR ROM:   AROM eval  Flexion 40; painful with flexion and returning to neutral  Extension 5  Right lateral flexion   Left lateral flexion   Right rotation 25% limited  Left rotation 50% limited   (Blank rows = not tested)  LOWER EXTREMITY ROM: WFL for activities assessed  LOWER EXTREMITY MMT:    MMT Right eval Left eval  Hip flexion 4-/5 3+/5  Hip extension    Hip abduction    Hip adduction    Hip internal rotation    Hip external rotation    Knee flexion 4+/5 4+/5  Knee extension 4/5 4/5  Ankle dorsiflexion 4/5 4/5  Ankle plantarflexion    Ankle inversion    Ankle eversion     (Blank rows = not tested)  FUNCTIONAL TESTS:  Sit to stand: maximal difficulty with upper extremity  support  GAIT: Assistive device utilized: Single point cane Level of assistance: Modified independence Comments: poor foot clearance and decreased step length bilaterally  TODAY'S TREATMENT:                                                                                                                              DATE:                                     10/13/22 EXERCISE LOG  Exercise Repetitions and Resistance Comments  Nustep  L2 x 18 minutes   LTR Attempted, but limited due to pain    Supine glute set 2.5 minutes w/ 5 second hold    Supine hip ADD isometric  3 minutes w/ 5 second hold   Supine march  3 minutes  Alternating LE   Static stance on foam  3 minutes   Intermittent UE support; cueing for upright stance  Marching on foam  2 minutes  BUE support; cueing for upright stance   Blank cell = exercise not performed today   PATIENT EDUCATION:  Education details: dry needling, healing, and plan of care Person educated: Patient Education method: Explanation Education comprehension: verbalized understanding  HOME EXERCISE PROGRAM:   ASSESSMENT:  CLINICAL IMPRESSION: Patient was introduced to multiple new interventions for improved lumbar and lower extremity strengthening needed for improved function with his daily activities. He required minimal cueing with today's isometric interventions for proper exercise performance  for facilitate appropriate musculature engagement. He experienced a brief increase in pain with lower trunk rotations, but this did not persist into today's other interventions. He reported feeling good upon the conclusion of treatment. He continues to require skilled physical therapy to address his remaining impairments to return to his prior level of function.  OBJECTIVE IMPAIRMENTS: Abnormal gait, decreased activity tolerance, decreased mobility, difficulty walking, decreased ROM, decreased strength, hypomobility, impaired tone, postural dysfunction, and pain.    ACTIVITY LIMITATIONS: carrying, lifting, bending, standing, transfers, and locomotion level  PARTICIPATION LIMITATIONS: shopping, community activity, and yard work  PERSONAL FACTORS: Age and 3+ comorbidities: History of a cerebral infarction, history of prostate cancer, and hypertension  are also affecting patient's functional outcome.   REHAB POTENTIAL: Good  CLINICAL DECISION MAKING: Evolving/moderate complexity  EVALUATION COMPLEXITY: Moderate   GOALS: Goals reviewed with patient? Yes  SHORT TERM GOALS: Target date: 10/20/22  Patient will be independent with his initial HEP. Baseline: Goal status: INITIAL  2.  Patient will be able to complete his daily activities without his familiar pain exceeding 8/10. Baseline:  Goal status: INITIAL  LONG TERM GOALS: Target date: 11/10/22  Patient will be independent with his advanced HEP. Baseline:  Goal status: INITIAL  2.  Patient will be able to complete his daily activities without his familiar pain exceeding 6/10. Baseline:  Goal status: INITIAL  3.  Patient will report being able to complete his yard work without being limited by his familiar symptoms. Baseline:  Goal status: INITIAL  4.  Patient will be able to transfer from sitting to standing from a standard height chair with minimal difficulty for improved independence. Baseline:  Goal status: INITIAL  5.  Patient will be able to ambulate with improved foot clearance to reduce his risk of tripping and falling for improved household safety. Baseline:  Goal status: INITIAL  PLAN:  PT FREQUENCY: 2x/week  PT DURATION: other: 5 weeks  PLANNED INTERVENTIONS: Therapeutic exercises, Therapeutic activity, Neuromuscular re-education, Balance training, Gait training, Patient/Family education, Self Care, Joint mobilization, Stair training, Dry Needling, Electrical stimulation, Spinal mobilization, Cryotherapy, Moist heat, Traction, Ultrasound, Manual therapy, and  Re-evaluation.  PLAN FOR NEXT SESSION: NuStep, postural reeducation, lumbar and lower extremity strengthening, and modalities as needed   Granville Lewis, PT 10/13/2022, 4:15 PM

## 2022-10-15 ENCOUNTER — Ambulatory Visit: Payer: Medicare PPO | Admitting: *Deleted

## 2022-10-15 ENCOUNTER — Encounter: Payer: Self-pay | Admitting: *Deleted

## 2022-10-15 DIAGNOSIS — M5459 Other low back pain: Secondary | ICD-10-CM

## 2022-10-15 DIAGNOSIS — E559 Vitamin D deficiency, unspecified: Secondary | ICD-10-CM | POA: Diagnosis not present

## 2022-10-15 DIAGNOSIS — R262 Difficulty in walking, not elsewhere classified: Secondary | ICD-10-CM

## 2022-10-15 DIAGNOSIS — Z8673 Personal history of transient ischemic attack (TIA), and cerebral infarction without residual deficits: Secondary | ICD-10-CM | POA: Diagnosis not present

## 2022-10-15 DIAGNOSIS — R293 Abnormal posture: Secondary | ICD-10-CM

## 2022-10-15 DIAGNOSIS — I25119 Atherosclerotic heart disease of native coronary artery with unspecified angina pectoris: Secondary | ICD-10-CM | POA: Diagnosis not present

## 2022-10-15 DIAGNOSIS — E785 Hyperlipidemia, unspecified: Secondary | ICD-10-CM | POA: Diagnosis not present

## 2022-10-15 NOTE — Therapy (Signed)
OUTPATIENT PHYSICAL THERAPY THORACOLUMBAR TREATMENT   Patient Name: Brett Leonard MRN: [id number] DOB:08-16-1931, 89 y.o., male Today's Date: 10/15/2022  END OF SESSION:  PT End of Session - 10/15/22 1515     Visit Number 3    Number of Visits 10    Date for PT Re-Evaluation 12/11/22    PT Start Time 1515    PT Stop Time 1605    PT Time Calculation (min) 50 min              Past Medical History:  Diagnosis Date   Body mass index (BMI) of 20.0-20.9 in adult    Cancer [facility])    Prostate   Hypertension    Palpitations    Renal insufficiency    Past Surgical History:  Procedure Laterality Date   CORONARY ARTERY BYPASS GRAFT  10/24/2009   Patient Active Problem List   Diagnosis Date Noted   DYSLIPIDEMIA 11/26/2009   Coronary atherosclerosis 11/26/2009   Cerebral artery occlusion with cerebral infarction (HCC) 11/26/2009   Pleural effusion 11/26/2009   REFERRING PROVIDER: Dawley, Alan Mulder, DO   REFERRING DIAG: Spondylosis without myelopathy or radiculopathy, lumbar region   Rationale for Evaluation and Treatment: Rehabilitation  THERAPY DIAG:  Other low back pain  Abnormal posture  Difficulty in walking, not elsewhere classified  ONSET DATE: "a couple weeks ago"  SUBJECTIVE:                                                                                                                                                                                           SUBJECTIVE STATEMENT: Patient reports that he is feeling a lot better and wants to be put on hold  PERTINENT HISTORY:  History of a cerebral infarction, history of prostate cancer, and hypertension  PAIN:  Are you having pain? Yes: NPRS scale: 0/10 Pain location: low back (L>R) Pain description: intermittent sharp pain Aggravating factors: none known Relieving factors: medication, laying supine  PRECAUTIONS: None  RED FLAGS: None   WEIGHT BEARING RESTRICTIONS: No  FALLS:  Has patient  fallen in last 6 months? No  LIVING ENVIRONMENT: Lives with: lives alone Lives in: House/apartment Stairs: No Has following equipment at home: Single point cane and Ramped entry  OCCUPATION: retired  PLOF: Independent  PATIENT GOALS: reduced pain and be able to do yard work around his house   NEXT MD VISIT: none scheduled  OBJECTIVE: all objective measures were assessed at his initial evaluation on 10/06/22 unless otherwise noted  PATIENT SURVEYS:  FOTO to be assessed at first follow up  SCREENING FOR RED FLAGS: Bowel or bladder incontinence: No Spinal tumors:  No Cauda equina syndrome: No Compression fracture: No Abdominal aneurysm: No  COGNITION: Overall cognitive status: Within functional limits for tasks assessed     SENSATION: Patient reports no numbness or tingling  POSTURE: rounded shoulders, forward head, decreased lumbar lordosis, and increased thoracic kyphosis  PALPATION: TTP: left QL (minimal)   JOINT MOBILITY:  Hypomobile and nonpainful throughout lumbar spine  LUMBAR ROM:   AROM eval  Flexion 40; painful with flexion and returning to neutral  Extension 5  Right lateral flexion   Left lateral flexion   Right rotation 25% limited  Left rotation 50% limited   (Blank rows = not tested)  LOWER EXTREMITY ROM: WFL for activities assessed  LOWER EXTREMITY MMT:    MMT Right eval Left eval  Hip flexion 4-/5 3+/5  Hip extension    Hip abduction    Hip adduction    Hip internal rotation    Hip external rotation    Knee flexion 4+/5 4+/5  Knee extension 4/5 4/5  Ankle dorsiflexion 4/5 4/5  Ankle plantarflexion    Ankle inversion    Ankle eversion     (Blank rows = not tested)  FUNCTIONAL TESTS:  Sit to stand: maximal difficulty with upper extremity support  GAIT: Assistive device utilized: Single point cane Level of assistance: Modified independence Comments: poor foot clearance and decreased step length bilaterally  TODAY'S TREATMENT:                                                                                                                               DATE:                                     10/15/22 EXERCISE LOG  Exercise Repetitions and Resistance Comments  Nustep  L2 x 15 minutes       Supine glute set 2x10 w/ 5 second hold    Bridging  2x10   Supine hip ADD isometric  3 minutes w/ 5 second hold with ball squeeze   Supine march  3 x10 Alternating LE   Static stance on foam   Intermittent UE support; cueing for upright stance  Marching on foam   BUE support; cueing for upright stance   Blank cell = exercise not performed today  Discussed walking program and HEP. Handout given to HEP   PATIENT EDUCATION:  Education details: dry needling, healing, and plan of care Person educated: Patient Education method: Explanation Education comprehension: verbalized understanding  HOME EXERCISE PROGRAM:   ASSESSMENT:  CLINICAL IMPRESSION: Pt  arrived today doing much better with no LBP. Rx focused on exs and HEP. Handout given with instructions for reps and sets per day. Pt was able to meet STG's and 3/5 LTG's.    OBJECTIVE IMPAIRMENTS: Abnormal gait, decreased activity tolerance, decreased mobility, difficulty walking, decreased ROM, decreased strength, hypomobility, impaired tone, postural dysfunction, and  pain.   ACTIVITY LIMITATIONS: carrying, lifting, bending, standing, transfers, and locomotion level  PARTICIPATION LIMITATIONS: shopping, community activity, and yard work  PERSONAL FACTORS: Age and 3+ comorbidities: History of a cerebral infarction, history of prostate cancer, and hypertension  are also affecting patient's functional outcome.   REHAB POTENTIAL: Good  CLINICAL DECISION MAKING: Evolving/moderate complexity  EVALUATION COMPLEXITY: Moderate   GOALS: Goals reviewed with patient? Yes  SHORT TERM GOALS: Target date: 10/20/22  Patient will be independent with his initial  HEP. Baseline: Goal status: MET  2.  Patient will be able to complete his daily activities without his familiar pain exceeding 8/10. Baseline:  Goal status: MET  LONG TERM GOALS: Target date: 11/10/22  Patient will be independent with his advanced HEP. Baseline:  Goal status: INITIAL  2.  Patient will be able to complete his daily activities without his familiar pain exceeding 6/10. Baseline:  Goal status: MET  3.  Patient will report being able to complete his yard work without being limited by his familiar symptoms. Baseline:  Goal status: MET  4.  Patient will be able to transfer from sitting to standing from a standard height chair with minimal difficulty for improved independence. Baseline:  Goal status: MET  5.  Patient will be able to ambulate with improved foot clearance to reduce his risk of tripping and falling for improved household safety. Baseline:  Goal status: INITIAL  PLAN:  PT FREQUENCY: 2x/week  PT DURATION: other: 5 weeks  PLANNED INTERVENTIONS: Therapeutic exercises, Therapeutic activity, Neuromuscular re-education, Balance training, Gait training, Patient/Family education, Self Care, Joint mobilization, Stair training, Dry Needling, Electrical stimulation, Spinal mobilization, Cryotherapy, Moist heat, Traction, Ultrasound, Manual therapy, and Re-evaluation.  PLAN FOR NEXT SESSION: NuStep, postural reeducation, lumbar and lower extremity strengthening, and modalities as needed   Rifka Ramey,CHRIS, PTA 10/15/2022, 5:55 PM

## 2022-10-23 DIAGNOSIS — E785 Hyperlipidemia, unspecified: Secondary | ICD-10-CM | POA: Diagnosis not present

## 2022-10-23 DIAGNOSIS — R002 Palpitations: Secondary | ICD-10-CM | POA: Diagnosis not present

## 2022-10-23 DIAGNOSIS — Z23 Encounter for immunization: Secondary | ICD-10-CM | POA: Diagnosis not present

## 2022-10-23 DIAGNOSIS — Z6820 Body mass index (BMI) 20.0-20.9, adult: Secondary | ICD-10-CM | POA: Diagnosis not present

## 2022-10-23 DIAGNOSIS — N400 Enlarged prostate without lower urinary tract symptoms: Secondary | ICD-10-CM | POA: Diagnosis not present

## 2022-10-23 DIAGNOSIS — Z87898 Personal history of other specified conditions: Secondary | ICD-10-CM | POA: Diagnosis not present

## 2022-10-23 DIAGNOSIS — J302 Other seasonal allergic rhinitis: Secondary | ICD-10-CM | POA: Diagnosis not present

## 2022-10-23 DIAGNOSIS — I25119 Atherosclerotic heart disease of native coronary artery with unspecified angina pectoris: Secondary | ICD-10-CM | POA: Diagnosis not present

## 2022-11-24 DIAGNOSIS — R739 Hyperglycemia, unspecified: Secondary | ICD-10-CM | POA: Diagnosis not present

## 2023-04-19 DIAGNOSIS — E559 Vitamin D deficiency, unspecified: Secondary | ICD-10-CM | POA: Diagnosis not present

## 2023-04-19 DIAGNOSIS — E785 Hyperlipidemia, unspecified: Secondary | ICD-10-CM | POA: Diagnosis not present

## 2023-04-19 DIAGNOSIS — E7800 Pure hypercholesterolemia, unspecified: Secondary | ICD-10-CM | POA: Diagnosis not present

## 2023-04-19 DIAGNOSIS — R739 Hyperglycemia, unspecified: Secondary | ICD-10-CM | POA: Diagnosis not present

## 2023-04-19 DIAGNOSIS — E7801 Familial hypercholesterolemia: Secondary | ICD-10-CM | POA: Diagnosis not present

## 2023-04-27 DIAGNOSIS — E7800 Pure hypercholesterolemia, unspecified: Secondary | ICD-10-CM | POA: Diagnosis not present

## 2023-04-27 DIAGNOSIS — Z681 Body mass index (BMI) 19 or less, adult: Secondary | ICD-10-CM | POA: Diagnosis not present

## 2023-04-27 DIAGNOSIS — E559 Vitamin D deficiency, unspecified: Secondary | ICD-10-CM | POA: Diagnosis not present

## 2023-04-27 DIAGNOSIS — R002 Palpitations: Secondary | ICD-10-CM | POA: Diagnosis not present

## 2023-04-27 DIAGNOSIS — M549 Dorsalgia, unspecified: Secondary | ICD-10-CM | POA: Diagnosis not present

## 2023-04-29 DIAGNOSIS — Z681 Body mass index (BMI) 19 or less, adult: Secondary | ICD-10-CM | POA: Diagnosis not present

## 2023-04-29 DIAGNOSIS — R002 Palpitations: Secondary | ICD-10-CM | POA: Diagnosis not present

## 2023-04-29 DIAGNOSIS — Z87898 Personal history of other specified conditions: Secondary | ICD-10-CM | POA: Diagnosis not present

## 2023-05-28 ENCOUNTER — Other Ambulatory Visit: Payer: Self-pay

## 2023-05-28 ENCOUNTER — Emergency Department (HOSPITAL_COMMUNITY)
Admission: EM | Admit: 2023-05-28 | Discharge: 2023-05-28 | Disposition: A | Discharge status: Home / Self Care | Attending: Emergency Medicine | Admitting: Emergency Medicine

## 2023-05-28 ENCOUNTER — Emergency Department (HOSPITAL_COMMUNITY)

## 2023-05-28 ENCOUNTER — Encounter (HOSPITAL_COMMUNITY): Payer: Self-pay

## 2023-05-28 DIAGNOSIS — Z681 Body mass index (BMI) 19 or less, adult: Secondary | ICD-10-CM | POA: Diagnosis not present

## 2023-05-28 DIAGNOSIS — I493 Ventricular premature depolarization: Secondary | ICD-10-CM

## 2023-05-28 DIAGNOSIS — Z7982 Long term (current) use of aspirin: Secondary | ICD-10-CM | POA: Diagnosis not present

## 2023-05-28 DIAGNOSIS — J439 Emphysema, unspecified: Secondary | ICD-10-CM | POA: Diagnosis not present

## 2023-05-28 DIAGNOSIS — I2510 Atherosclerotic heart disease of native coronary artery without angina pectoris: Secondary | ICD-10-CM | POA: Insufficient documentation

## 2023-05-28 DIAGNOSIS — I498 Other specified cardiac arrhythmias: Secondary | ICD-10-CM

## 2023-05-28 DIAGNOSIS — I10 Essential (primary) hypertension: Secondary | ICD-10-CM | POA: Insufficient documentation

## 2023-05-28 DIAGNOSIS — Z8673 Personal history of transient ischemic attack (TIA), and cerebral infarction without residual deficits: Secondary | ICD-10-CM | POA: Insufficient documentation

## 2023-05-28 DIAGNOSIS — Z79899 Other long term (current) drug therapy: Secondary | ICD-10-CM | POA: Insufficient documentation

## 2023-05-28 DIAGNOSIS — R001 Bradycardia, unspecified: Secondary | ICD-10-CM | POA: Diagnosis not present

## 2023-05-28 LAB — CBC WITH DIFFERENTIAL/PLATELET
Abs Immature Granulocytes: 0.01 10*3/uL (ref 0.00–0.07)
Basophils Absolute: 0.1 10*3/uL (ref 0.0–0.1)
Basophils Relative: 1 %
Eosinophils Absolute: 0.4 10*3/uL (ref 0.0–0.5)
Eosinophils Relative: 5 %
HCT: 39.6 % (ref 39.0–52.0)
Hemoglobin: 13.2 g/dL (ref 13.0–17.0)
Immature Granulocytes: 0 %
Lymphocytes Relative: 35 %
Lymphs Abs: 2.4 10*3/uL (ref 0.7–4.0)
MCH: 30.7 pg (ref 26.0–34.0)
MCHC: 33.3 g/dL (ref 30.0–36.0)
MCV: 92.1 fL (ref 80.0–100.0)
Monocytes Absolute: 0.6 10*3/uL (ref 0.1–1.0)
Monocytes Relative: 9 %
Neutro Abs: 3.4 10*3/uL (ref 1.7–7.7)
Neutrophils Relative %: 50 %
Platelets: 169 10*3/uL (ref 150–400)
RBC: 4.3 MIL/uL (ref 4.22–5.81)
RDW: 13.5 % (ref 11.5–15.5)
WBC: 6.8 10*3/uL (ref 4.0–10.5)
nRBC: 0 % (ref 0.0–0.2)

## 2023-05-28 LAB — COMPREHENSIVE METABOLIC PANEL WITH GFR
ALT: 20 U/L (ref 0–44)
AST: 26 U/L (ref 15–41)
Albumin: 3.8 g/dL (ref 3.5–5.0)
Alkaline Phosphatase: 53 U/L (ref 38–126)
Anion gap: 7 (ref 5–15)
BUN: 25 mg/dL — ABNORMAL HIGH (ref 8–23)
CO2: 26 mmol/L (ref 22–32)
Calcium: 9.5 mg/dL (ref 8.9–10.3)
Chloride: 102 mmol/L (ref 98–111)
Creatinine, Ser: 1.19 mg/dL (ref 0.61–1.24)
GFR, Estimated: 58 mL/min — ABNORMAL LOW (ref >60)
Glucose, Bld: 105 mg/dL — ABNORMAL HIGH (ref 70–99)
Potassium: 4.5 mmol/L (ref 3.5–5.1)
Sodium: 135 mmol/L (ref 135–145)
Total Bilirubin: 0.7 mg/dL (ref 0.0–1.2)
Total Protein: 7.1 g/dL (ref 6.5–8.1)

## 2023-05-28 LAB — PROTIME-INR
INR: 1 (ref 0.8–1.2)
Prothrombin Time: 13.7 s (ref 11.4–15.2)

## 2023-05-28 LAB — APTT: aPTT: 27 s (ref 24–36)

## 2023-05-28 LAB — TROPONIN I (HIGH SENSITIVITY): Troponin I (High Sensitivity): 8 ng/L (ref <18)

## 2023-05-28 LAB — TSH: TSH: 4.088 u[IU]/mL (ref 0.350–4.500)

## 2023-05-28 LAB — MAGNESIUM: Magnesium: 2.2 mg/dL (ref 1.7–2.4)

## 2023-05-28 NOTE — Progress Notes (Signed)
 Patient discussed with ER staff, sent from pcp office with reported bradycardia reportedly in the 30s. Patient has been asymptomatic. He has a long history of PVCs from prior monitors. EKG shows bigeminy in the 70s. Telemetry reviewed, sinus rhythm and PVCs all rates above 60s. Labs are fine  Looks to be the common pattern where heart rates are read as falsely low on dynamap or pulse oximeter in the setting of PVCs. Even in ER his rates by bp cuff are in the 30s with tele rates 60s and 70s. There is no additional workup indicated, would educate patient about this pattern and encourage him to ask for an EKG to be done if he is in a medical setting and there are concerns about low heart rates as this scenario will very likely happen again in the future. Will arrange follow up with his regular cardiologist.      Armida Lander MD.

## 2023-05-28 NOTE — Discharge Instructions (Signed)
 Your testing has been normal You can follow up in the office according to our cardiologist Dr. .Amanda Jungling - he will have the office call to arrange.  ER for worsening symptoms.

## 2023-05-28 NOTE — ED Triage Notes (Signed)
 Pt sent here from his dr office due to bradycardia of 30's. Pt currently in the 60's but pt stated that it comes and goes.

## 2023-05-28 NOTE — ED Provider Notes (Signed)
 [place] EMERGENCY DEPARTMENT AT [facility] Provider Note   CSN: [id number] Arrival date & time: 05/28/23  1334     History  Chief Complaint  Patient presents with   Bradycardia    Brett Leonard is a 89 y.o. male.  HPI   This patient comes from his doctor's office with a complaint of bradycardia.  He is a 89 year old male with a prior history of coronary disease status post bypass grafting years ago, he did have a small stroke after the surgery.  He is maintained on a baby aspirin metoprolol levothyroxine and tamsulosin, no other daily medicines.  Evidently this patient had felt like he has been having runs of palpitations at night followed by long pauses, this has happened several times over the last few days and when he went to get checked up at his doctor's office today he was noted to have a heart rate in the 30s and redirected to the emergency department.  The paramedic strip which I have personally reviewed shows that the patient had what appeared to be normal sinus rhythm with occasional PVCs, on arrival he has what appears to be bigeminy with a rate of about 65 bpm.  The patient denies any recent fevers chills nausea vomiting diarrhea chest pain coughing shortness of breath headache or any weakness.  Home Medications Prior to Admission medications   Medication Sig Start Date End Date Taking? Authorizing Provider  aspirin EC 81 MG tablet Take 81 mg by mouth daily. Swallow whole.    [provider]  baclofen (LIORESAL) 10 MG tablet Take 10 mg by mouth 2 (two) times daily as needed for muscle spasms. 12/27/22   [provider]  docusate sodium (COLACE) 100 MG capsule Take 100 mg by mouth 2 (two) times daily.    [provider]  furosemide  (LASIX ) 20 MG tablet Take by mouth. 05/26/23   [provider]  furosemide  (LASIX ) 40 MG tablet take 1 tablet by mouth once a week. 05/28/22   Elmyra Haggard, MD  levothyroxine (SYNTHROID) 25 MCG  tablet Take 25 mcg by mouth daily. 05/28/23   [provider]  metoprolol tartrate (LOPRESSOR) 25 MG tablet Take 12.5 mg by mouth 2 (two) times daily.    [provider]  potassium chloride  (KLOR-CON ) 10 MEQ tablet take 1 tablet (10 meq total) by mouth once a week. 09/09/22   Elmyra Haggard, MD  potassium chloride  SA (KLOR-CON  M) 20 MEQ tablet Take by mouth. 05/26/23   [provider]  rosuvastatin  (CRESTOR ) 20 MG tablet take 1 tablet once daily. 05/28/22   Elmyra Haggard, MD  tamsulosin (FLOMAX) 0.4 MG CAPS capsule Take 0.4 mg by mouth in the morning and at bedtime.    [provider]      Allergies    Propoxyphene n-acetaminophen    Review of Systems   Review of Systems  All other systems reviewed and are negative.   Physical Exam Updated Vital Signs BP (!) 147/74 (BP Location: Right Arm)   Pulse 65   Temp 97.7 F (36.5 C) (Oral)   Resp 16   Ht 1.702 m (5\' 7" )   Wt 57.6 kg   SpO2 100%   BMI 19.89 kg/m  Physical Exam Vitals and nursing note reviewed.  Constitutional:      General: He is not in acute distress.    Appearance: He is well-developed.  HENT:     Head: Normocephalic and atraumatic.  Mouth/Throat:     Pharynx: No oropharyngeal exudate.  Eyes:     General: No scleral icterus.       Right eye: No discharge.        Left eye: No discharge.     Conjunctiva/sclera: Conjunctivae normal.     Pupils: Pupils are equal, round, and reactive to light.  Neck:     Thyroid : No thyromegaly.     Vascular: No JVD.  Cardiovascular:     Rate and Rhythm: Normal rate. Rhythm irregular.     Heart sounds: Normal heart sounds. No murmur heard.    No friction rub. No gallop.     Comments: Occasional ectopy, consistent with bigeminy on monitor, normal pulses no edema no JVD Pulmonary:     Effort: Pulmonary effort is normal. No respiratory distress.     Breath sounds: Normal breath sounds. No wheezing or rales.  Abdominal:     General: Bowel sounds  are normal. There is no distension.     Palpations: Abdomen is soft. There is no mass.     Tenderness: There is no abdominal tenderness.  Musculoskeletal:        General: No tenderness. Normal range of motion.     Cervical back: Normal range of motion and neck supple.  Lymphadenopathy:     Cervical: No cervical adenopathy.  Skin:    General: Skin is warm and dry.     Findings: No erythema or rash.  Neurological:     General: No focal deficit present.     Mental Status: He is alert.     Coordination: Coordination normal.  Psychiatric:        Behavior: Behavior normal.     ED Results / Procedures / Treatments   Labs (all labs ordered are listed, but only abnormal results are displayed) Labs Reviewed  COMPREHENSIVE METABOLIC PANEL WITH GFR - Abnormal; Notable for the following components:      Result Value   Glucose, Bld 105 (*)    BUN 25 (*)    GFR, Estimated 58 (*)    All other components within normal limits  CBC WITH DIFFERENTIAL/PLATELET  MAGNESIUM  TSH  PROTIME-INR  APTT  TROPONIN I (HIGH SENSITIVITY)    EKG EKG Interpretation Date/Time:  Friday May 28 2023 13:45:30 EDT Ventricular Rate:  74 PR Interval:  174 QRS Duration:  118 QT Interval:  432 QTC Calculation: 479 R Axis:   99  Text Interpretation: Normal sinus rhythm 74 bpm in a pattern of bigeminy Right bundle branch block Nonspecific T wave abnormality Since last tracing ectopy now present Confirmed by Early Glisson ([id number]) on 05/28/2023 1:52:15 PM  Radiology DG Chest Port 1 View Result Date: 05/28/2023 CLINICAL DATA:  Opacification. EXAM: PORTABLE CHEST 1 VIEW COMPARISON:  Chest CT dated 12/16/2009. FINDINGS: Emphysema. No focal consolidation, pleural effusion, pneumothorax. The cardiac silhouette is within normal limits. Median sternotomy wires and CABG vascular clips. No acute osseous pathology. IMPRESSION: 1. No active disease. 2. Emphysema. Electronically Signed   By: Angus Bark M.D.   On:  05/28/2023 14:31    Procedures Procedures    Medications Ordered in ED Medications - No data to display  ED Course/ Medical Decision Making/ A&P                                 Medical Decision Making Amount and/or Complexity of Data Reviewed Labs: ordered. Radiology: ordered.  This patient presents to the ED for concern of palpitations, this involves an extensive number of treatment options, and is a complaint that carries with it a high risk of complications and morbidity.  The differential diagnosis includes arrhythmia, certainly this could be supraventricular or ventricular but at this time it definitely appears to be at least bigeminy with frequent PVCs.  In this patient with known history of ischemic heart disease this is of concern   Co morbidities that complicate the patient evaluation  Hypertension, elderly, known coronary disease   Additional history obtained:  Additional history obtained from family doctor, I discussed the case with them and their desire to have the patient seen by cardiology External records from outside source obtained and reviewed including multiple prior outpatient visits, he has had syncopal visits, seen by cardiology, has been seen by Dr. Ola Berger as a cardiologist, most recently cardiac monitoring in May 2023 which showed ventricular bigeminy and trigeminy that were present at the time.  Runs of SVT, bundle branch block, several episodes of ventricular tachycardia although the predominant underlying rhythm was sinus rhythm. Echocardiogram in July 2022 showed echocardiogram that was normal with only grade 1 diastolic dysfunction   Lab Tests:  I Ordered, and personally interpreted labs.  The pertinent results include: No significant lab abnormalities including basic metabolic and CBC   Imaging Studies ordered:  I ordered imaging studies including chest x-ray I independently visualized and interpreted imaging which showed emphysema but  no other acute findings I agree with the radiologist interpretation   Cardiac Monitoring: / EKG:  The patient was maintained on a cardiac monitor.  I personally viewed and interpreted the cardiac monitored which showed an underlying rhythm of: Patient continues to have frequent PVCs often in bigeminy   Problem List / ED Course / Critical interventions / Medication management  Discussed the care with Dr. Amanda Jungling of the cardiology service, he is in total agreement that this patient can be seen in the outpatient setting and does not need to be admitted to the hospital for any further workup at this time.  The patient has not had any symptoms while he is here, this was discussed with the patient and family member at the bedside and they are agreeable.  Dr. Amanda Jungling will ensure that the patient has good follow-up per his communication with me.   Consultations Obtained:  I requested consultation with the cardiologist Dr. Amanda Jungling,  and discussed lab and imaging findings as well as pertinent plan - they recommend: Outpatient follow-up which she will have the office arrange   Social Determinants of Health:  Elderly   Test / Admission - Considered:  History of frequent PVCs, this does not appear to be terribly different.  Stable for discharge         Final Clinical Impression(s) / ED Diagnoses Final diagnoses:  None    Rx / DC Orders ED Discharge Orders     None         Early Glisson, MD 05/28/23 1558

## 2023-06-04 DIAGNOSIS — I2510 Atherosclerotic heart disease of native coronary artery without angina pectoris: Secondary | ICD-10-CM | POA: Diagnosis not present

## 2023-06-04 DIAGNOSIS — Z6820 Body mass index (BMI) 20.0-20.9, adult: Secondary | ICD-10-CM | POA: Diagnosis not present

## 2023-06-04 DIAGNOSIS — Z8546 Personal history of malignant neoplasm of prostate: Secondary | ICD-10-CM | POA: Diagnosis not present

## 2023-06-04 DIAGNOSIS — N400 Enlarged prostate without lower urinary tract symptoms: Secondary | ICD-10-CM | POA: Diagnosis not present

## 2023-06-04 DIAGNOSIS — R001 Bradycardia, unspecified: Secondary | ICD-10-CM | POA: Diagnosis not present

## 2023-07-12 NOTE — Progress Notes (Signed)
 Cardiology Office Note   Date:  07/14/2023  ID:  Brett Leonard, DOB 04-26-1931, MRN [id number]  PCP:  [facility]  Cardiologist:   Vina Gull, MD   Pt presents for follow up of CAD, PVCs  and syncope   History of Present Illness: Brett Leonard is a 89 y.o. male with a history of CAD,PVCs, syncope and CVA  2011  Pt s/p MI ; underwent CABG x 4; (LIMA to LAD; SVG to diag; SVG to OM; SVG to [facility])  Post  CABG suffered CVA    July 2022, Pt driving.   Episode of LOC  No memory of event   At the time, he did note that his heart has been skipping, racing some Denied dizziness prior WOrk up:  Myoview  normal perfusion.  Echo LVEF normal   Carotid USN moderate plaquing    Monitor without signficant arrhythmia  2023  Monitor   SR  Frequent PVCs (18% total), longest run VT was 7 beats     The pt was last in cardiology clinic in 2023  May 2025  The pt was sent to ER   At PCP office he was found to be bradycardic (HR 30s)  Sent to ER.  In ER was not bradcardic but was found to have PVCs on telemetry   Seen by cardiology   Recomm follow up in clinic   Since seen in ER in May, the pt  notes no dizziness/syncope   He does say his heart will skip at times     He denies CP   Overall says his breathing is OK He has noticed no change in activity  for his age  Current Meds  Medication Sig   aspirin EC 81 MG tablet Take 81 mg by mouth daily. Swallow whole.   docusate sodium (COLACE) 100 MG capsule Take 100 mg by mouth 2 (two) times daily.   furosemide  (LASIX ) 40 MG tablet take 1 tablet by mouth once a week.   levothyroxine (SYNTHROID) 25 MCG tablet Take 25 mcg by mouth daily.   potassium chloride  (KLOR-CON ) 10 MEQ tablet take 1 tablet (10 meq total) by mouth once a week.   rosuvastatin  (CRESTOR ) 20 MG tablet take 1 tablet once daily.   tamsulosin (FLOMAX) 0.4 MG CAPS capsule Take 0.4 mg by mouth in the morning and at bedtime.     Allergies:   Propoxyphene n-acetaminophen    Past Medical History:  Diagnosis Date   Body mass index (BMI) of 20.0-20.9 in adult    Cancer [facility])    Prostate   Hypertension    Palpitations    Renal insufficiency     Past Surgical History:  Procedure Laterality Date   CORONARY ARTERY BYPASS GRAFT  10/24/2009     Social History:  The patient  reports that he has never smoked. He has never used smokeless tobacco. He reports that he does not drink alcohol  and does not use drugs.   Family History:  The patient's family history includes Cancer in his father; Diabetes in his mother; Glaucoma in his mother.    ROS:  Please see the history of present illness. All other systems are reviewed and  Negative to the above problem except as noted.    PHYSICAL EXAM: VS:  BP 114/80   Pulse 71   Ht 5' 7 (1.702 m)   Wt 123 lb 9.6 oz (56.1 kg)   SpO2 98%   BMI 19.36 kg/m   GEN: Thin  89 yo  in no acute distress HEENT: normal  Neck: no JVD, no carotid bruits Cardiac: RRR;Occasional skip   GrI/VI systolic murmur apex   Respiratory:  clear to auscultation GI: soft, nontender,  No hepatomegaly  MS: no deformity Moving all extremities   Skin: warm and dry, no rash  EKG:  EKG is not done today  Lipid Panel    Component Value Date/Time   CHOL 154 08/06/2020 1003   TRIG 66 08/06/2020 1003   HDL 53 08/06/2020 1003   CHOLHDL 2.9 08/06/2020 1003   VLDL 13 08/06/2020 1003   LDLCALC 88 08/06/2020 1003      Wt Readings from Last 3 Encounters:  07/14/23 123 lb 9.6 oz (56.1 kg)  05/28/23 127 lb (57.6 kg)  05/07/21 131 lb 6.4 oz (59.6 kg)      ASSESSMENT AND PLAN:  1 Bradycardia  Recent ER visit for bradycardia  Most likely pulsw was not picking up PVcs     HR is OK today    Given Hx of PVCs  I would recomm repeat Zio patch (2 wk)  Continue activiies asa tolerated  2  Hx of syncope   Remote spell  Work up negative   No recurrence   Follow   3  CAD  Pt s/p CABG x 4  in 2011   Myoview  in 2022 without ischemia   Pt without CP    Follow     4  HTN  BP is controlled on current regimen\  5  HL   Last lipids in 2023   LDL 75  He is not on statin   WOuld repeat  Labs   6  Hx of CVA  Occurred post CABG   Follow      Current medicines are reviewed at length with the patient today.  The patient does not have concerns regarding medicines.  Signed, Vina Gull, MD    [facility] [address] [place], [place], [place]  [address] Phone: [phone]; Fax: [phone]

## 2023-07-14 ENCOUNTER — Ambulatory Visit: Attending: Internal Medicine | Admitting: Internal Medicine

## 2023-07-14 ENCOUNTER — Ambulatory Visit

## 2023-07-14 ENCOUNTER — Encounter: Payer: Self-pay | Admitting: Internal Medicine

## 2023-07-14 VITALS — BP 114/80 | HR 71 | Ht 67.0 in | Wt 123.6 lb

## 2023-07-14 DIAGNOSIS — I493 Ventricular premature depolarization: Secondary | ICD-10-CM

## 2023-07-14 NOTE — Patient Instructions (Addendum)
 Medication Instructions:  Your physician recommends that you continue on your current medications as directed. Please refer to the Current Medication list given to you today.  *If you need a refill on your cardiac medications before your next appointment, please call your pharmacy*  Lab Work: NONE   If you have labs (blood work) drawn today and your tests are completely normal, you will receive your results only by: MyChart Message (if you have MyChart) OR A paper copy in the mail If you have any lab test that is abnormal or we need to change your treatment, we will call you to review the results.  Testing/Procedures: ZIO XT- Long Term Monitor Instructions   Your physician has requested you wear your ZIO patch monitor_____14__days.   This is a single patch monitor.  [employer] supplies one patch monitor per enrollment.  Additional stickers are not available.   Please do not apply patch if you will be having a Nuclear Stress Test, Echocardiogram, Cardiac CT, MRI, or Chest Xray during the time frame you would be wearing the monitor. The patch cannot be worn during these tests.  You cannot remove and re-apply the ZIO XT patch monitor.      Do not shower for the first 24 hours.  You may shower after the first 24 hours.   Press button if you feel a symptom. You will hear a small click.  Record Date, Time and Symptom in the Patient Log Book.   When you are ready to remove patch, follow instructions on last 2 pages of Patient Log Book.  Stick patch monitor onto last page of Patient Log Book.   Place Patient Log Book in [place] box.  Use locking tab on box and tape box closed securely.  The [employer] and [employer] has [employer] on it.  Please place in mailbox as soon as possible.  Your physician should have your test results approximately 7 days after the monitor has been mailed back to [facility].   Call [facility] Customer Care at [phone] if you have questions regarding your ZIO  XT patch monitor.  Call them immediately if you see an orange light blinking on your monitor.   If your monitor falls off in less than 4 days contact our Monitor department at [phone].  If your monitor becomes loose or falls off after 4 days call [employer] at [phone] for suggestions on securing your monitor.    Follow-Up: At [facility], you and your health needs are our priority.  As part of our continuing mission to provide you with exceptional heart care, our providers are all part of one team.  This team includes your primary Cardiologist (physician) and Advanced Practice Providers or APPs (Physician Assistants and Nurse Practitioners) who all work together to provide you with the care you need, when you need it.  Your next appointment:    To Be Determined   Provider:   You may see Vina Gull, MD or one of the following Advanced Practice Providers on your designated Care Team:   Laymon Qua, PA-C  [place], [place] Olivia Pavy, [place]     We recommend signing up for the patient portal called MyChart.  Sign up information is provided on this After Visit Summary.  MyChart is used to connect with patients for Virtual Visits (Telemedicine).  Patients are able to view lab/test results, encounter notes, upcoming appointments, etc.  Non-urgent messages can be sent to your provider as well.   To learn more about  what you can do with MyChart, go to [email or web address].   Other Instructions Thank you for choosing [facility]!

## 2023-07-16 ENCOUNTER — Telehealth: Payer: Self-pay | Admitting: Internal Medicine

## 2023-07-16 DIAGNOSIS — Z13220 Encounter for screening for lipoid disorders: Secondary | ICD-10-CM

## 2023-07-16 NOTE — Telephone Encounter (Signed)
 Patient should have lipomed panel checked

## 2023-07-20 NOTE — Telephone Encounter (Signed)
 Left message for daughter to confirm which lab they use

## 2023-07-28 NOTE — Telephone Encounter (Signed)
 Spoke with daughter and they use [facility]. Order placed. They will try to have lab done on Friday.

## 2023-07-28 NOTE — Addendum Note (Signed)
 Addended by: Sheray Grist G on: 07/28/2023 09:46 AM   Modules accepted: Orders

## 2023-07-30 DIAGNOSIS — E785 Hyperlipidemia, unspecified: Secondary | ICD-10-CM | POA: Diagnosis not present

## 2023-07-30 DIAGNOSIS — Z13220 Encounter for screening for lipoid disorders: Secondary | ICD-10-CM | POA: Diagnosis not present

## 2023-07-31 LAB — NMR, LIPOPROFILE
Cholesterol, Total: 146 mg/dL (ref 100–199)
HDL Particle Number: 39.2 umol/L (ref >=30.5)
HDL-C: 60 mg/dL (ref >39)
LDL Particle Number: 860 nmol/L (ref <1000)
LDL Size: 20.1 nm — ABNORMAL LOW (ref >20.5)
LDL-C (NIH Calc): 70 mg/dL (ref 0–99)
LP-IR Score: 43 (ref <=45)
Small LDL Particle Number: 457 nmol/L (ref <=527)
Triglycerides: 84 mg/dL (ref 0–149)

## 2023-08-02 ENCOUNTER — Ambulatory Visit: Payer: Self-pay | Admitting: Internal Medicine

## 2023-08-05 DIAGNOSIS — I493 Ventricular premature depolarization: Secondary | ICD-10-CM | POA: Diagnosis not present

## 2023-08-09 ENCOUNTER — Ambulatory Visit: Payer: Self-pay | Admitting: Internal Medicine

## 2023-08-09 DIAGNOSIS — I493 Ventricular premature depolarization: Secondary | ICD-10-CM

## 2023-08-30 DIAGNOSIS — E559 Vitamin D deficiency, unspecified: Secondary | ICD-10-CM | POA: Diagnosis not present

## 2023-08-30 DIAGNOSIS — R739 Hyperglycemia, unspecified: Secondary | ICD-10-CM | POA: Diagnosis not present

## 2023-08-30 DIAGNOSIS — E785 Hyperlipidemia, unspecified: Secondary | ICD-10-CM | POA: Diagnosis not present

## 2023-08-30 DIAGNOSIS — E7801 Familial hypercholesterolemia: Secondary | ICD-10-CM | POA: Diagnosis not present

## 2023-09-02 DIAGNOSIS — Z681 Body mass index (BMI) 19 or less, adult: Secondary | ICD-10-CM | POA: Diagnosis not present

## 2023-09-02 DIAGNOSIS — I2510 Atherosclerotic heart disease of native coronary artery without angina pectoris: Secondary | ICD-10-CM | POA: Diagnosis not present

## 2023-09-02 DIAGNOSIS — Z8546 Personal history of malignant neoplasm of prostate: Secondary | ICD-10-CM | POA: Diagnosis not present

## 2023-09-02 DIAGNOSIS — R001 Bradycardia, unspecified: Secondary | ICD-10-CM | POA: Diagnosis not present

## 2023-09-02 DIAGNOSIS — N400 Enlarged prostate without lower urinary tract symptoms: Secondary | ICD-10-CM | POA: Diagnosis not present

## 2024-01-19 ENCOUNTER — Emergency Department (HOSPITAL_COMMUNITY)

## 2024-01-19 ENCOUNTER — Encounter (HOSPITAL_COMMUNITY): Payer: Self-pay | Admitting: Hospitalist

## 2024-01-19 ENCOUNTER — Other Ambulatory Visit: Payer: Self-pay

## 2024-01-19 ENCOUNTER — Observation Stay (HOSPITAL_COMMUNITY)
Admission: EM | Admit: 2024-01-19 | Discharge: 2024-01-21 | Disposition: A | Discharge status: Home / Self Care | Attending: Emergency Medicine | Admitting: Emergency Medicine

## 2024-01-19 DIAGNOSIS — I2510 Atherosclerotic heart disease of native coronary artery without angina pectoris: Secondary | ICD-10-CM | POA: Insufficient documentation

## 2024-01-19 DIAGNOSIS — E039 Hypothyroidism, unspecified: Secondary | ICD-10-CM | POA: Diagnosis not present

## 2024-01-19 DIAGNOSIS — Z79899 Other long term (current) drug therapy: Secondary | ICD-10-CM | POA: Insufficient documentation

## 2024-01-19 DIAGNOSIS — Z7982 Long term (current) use of aspirin: Secondary | ICD-10-CM | POA: Diagnosis not present

## 2024-01-19 DIAGNOSIS — Z8673 Personal history of transient ischemic attack (TIA), and cerebral infarction without residual deficits: Secondary | ICD-10-CM | POA: Insufficient documentation

## 2024-01-19 DIAGNOSIS — I10 Essential (primary) hypertension: Secondary | ICD-10-CM | POA: Diagnosis not present

## 2024-01-19 DIAGNOSIS — G9389 Other specified disorders of brain: Secondary | ICD-10-CM

## 2024-01-19 DIAGNOSIS — E785 Hyperlipidemia, unspecified: Secondary | ICD-10-CM | POA: Diagnosis not present

## 2024-01-19 DIAGNOSIS — R262 Difficulty in walking, not elsewhere classified: Principal | ICD-10-CM | POA: Insufficient documentation

## 2024-01-19 DIAGNOSIS — N400 Enlarged prostate without lower urinary tract symptoms: Secondary | ICD-10-CM | POA: Diagnosis not present

## 2024-01-19 DIAGNOSIS — Z959 Presence of cardiac and vascular implant and graft, unspecified: Secondary | ICD-10-CM | POA: Diagnosis not present

## 2024-01-19 DIAGNOSIS — M79604 Pain in right leg: Secondary | ICD-10-CM

## 2024-01-19 DIAGNOSIS — R531 Weakness: Secondary | ICD-10-CM | POA: Diagnosis present

## 2024-01-19 DIAGNOSIS — R29898 Other symptoms and signs involving the musculoskeletal system: Secondary | ICD-10-CM | POA: Diagnosis not present

## 2024-01-19 LAB — CBC WITH DIFFERENTIAL/PLATELET
Abs Immature Granulocytes: 0.02 K/uL (ref 0.00–0.07)
Basophils Absolute: 0 K/uL (ref 0.0–0.1)
Basophils Relative: 1 %
Eosinophils Absolute: 0.2 K/uL (ref 0.0–0.5)
Eosinophils Relative: 3 %
HCT: 37.6 % — ABNORMAL LOW (ref 39.0–52.0)
Hemoglobin: 12.2 g/dL — ABNORMAL LOW (ref 13.0–17.0)
Immature Granulocytes: 0 %
Lymphocytes Relative: 28 %
Lymphs Abs: 2.3 K/uL (ref 0.7–4.0)
MCH: 30.2 pg (ref 26.0–34.0)
MCHC: 32.4 g/dL (ref 30.0–36.0)
MCV: 93.1 fL (ref 80.0–100.0)
Monocytes Absolute: 0.6 K/uL (ref 0.1–1.0)
Monocytes Relative: 8 %
Neutro Abs: 5.2 K/uL (ref 1.7–7.7)
Neutrophils Relative %: 60 %
Platelets: 155 K/uL (ref 150–400)
RBC: 4.04 MIL/uL — ABNORMAL LOW (ref 4.22–5.81)
RDW: 13.4 % (ref 11.5–15.5)
WBC: 8.4 K/uL (ref 4.0–10.5)
nRBC: 0 % (ref 0.0–0.2)

## 2024-01-19 LAB — URINALYSIS, ROUTINE W REFLEX MICROSCOPIC
Bilirubin Urine: NEGATIVE
Glucose, UA: NEGATIVE mg/dL
Hgb urine dipstick: NEGATIVE
Ketones, ur: NEGATIVE mg/dL
Leukocytes,Ua: NEGATIVE
Nitrite: NEGATIVE
Protein, ur: NEGATIVE mg/dL
Specific Gravity, Urine: 1.025 (ref 1.005–1.030)
pH: 5 (ref 5.0–8.0)

## 2024-01-19 LAB — BASIC METABOLIC PANEL WITH GFR
Anion gap: 6 (ref 5–15)
BUN: 21 mg/dL (ref 8–23)
CO2: 28 mmol/L (ref 22–32)
Calcium: 8.6 mg/dL — ABNORMAL LOW (ref 8.9–10.3)
Chloride: 106 mmol/L (ref 98–111)
Creatinine, Ser: 0.97 mg/dL (ref 0.61–1.24)
GFR, Estimated: >60 mL/min
Glucose, Bld: 142 mg/dL — ABNORMAL HIGH (ref 70–99)
Potassium: 3.6 mmol/L (ref 3.5–5.1)
Sodium: 141 mmol/L (ref 135–145)

## 2024-01-19 MED ORDER — DEXAMETHASONE SODIUM PHOSPHATE 4 MG/ML IJ SOLN
4.0000 mg | Freq: Three times a day (TID) | INTRAMUSCULAR | Status: DC
  Administered 2024-01-19 – 2024-01-21 (×6): 4 mg via INTRAVENOUS
  Filled 2024-01-19 (×6): qty 1

## 2024-01-19 MED ORDER — LEVOTHYROXINE SODIUM 25 MCG PO TABS
25.0000 ug | ORAL_TABLET | Freq: Every day | ORAL | Status: DC
  Administered 2024-01-20 – 2024-01-21 (×2): 25 ug via ORAL
  Filled 2024-01-19 (×2): qty 1

## 2024-01-19 MED ORDER — OXYCODONE HCL 5 MG PO TABS
5.0000 mg | ORAL_TABLET | Freq: Four times a day (QID) | ORAL | Status: DC | PRN
  Administered 2024-01-19: 5 mg via ORAL
  Filled 2024-01-19: qty 1

## 2024-01-19 MED ORDER — ROSUVASTATIN CALCIUM 20 MG PO TABS
20.0000 mg | ORAL_TABLET | Freq: Every day | ORAL | Status: DC
  Administered 2024-01-20 – 2024-01-21 (×2): 20 mg via ORAL
  Filled 2024-01-19 (×2): qty 1

## 2024-01-19 MED ORDER — ACETAMINOPHEN 650 MG RE SUPP: 650.0000 mg | Freq: Four times a day (QID) | RECTAL | Status: DC | PRN

## 2024-01-19 MED ORDER — GABAPENTIN 100 MG PO CAPS
100.0000 mg | ORAL_CAPSULE | Freq: Three times a day (TID) | ORAL | Status: DC
  Administered 2024-01-19 – 2024-01-21 (×6): 100 mg via ORAL
  Filled 2024-01-19 (×6): qty 1

## 2024-01-19 MED ORDER — SENNOSIDES-DOCUSATE SODIUM 8.6-50 MG PO TABS: 1.0000 | ORAL_TABLET | Freq: Every evening | ORAL | Status: DC | PRN

## 2024-01-19 MED ORDER — ASPIRIN 81 MG PO TBEC
81.0000 mg | DELAYED_RELEASE_TABLET | Freq: Every day | ORAL | Status: DC
  Administered 2024-01-19 – 2024-01-21 (×3): 81 mg via ORAL
  Filled 2024-01-19 (×3): qty 1

## 2024-01-19 MED ORDER — TAMSULOSIN HCL 0.4 MG PO CAPS
0.8000 mg | ORAL_CAPSULE | Freq: Every day | ORAL | Status: DC
  Administered 2024-01-20: 0.8 mg via ORAL
  Filled 2024-01-19: qty 2

## 2024-01-19 MED ORDER — ACETAMINOPHEN 325 MG PO TABS
650.0000 mg | ORAL_TABLET | Freq: Four times a day (QID) | ORAL | Status: DC | PRN
  Administered 2024-01-19: 650 mg via ORAL
  Filled 2024-01-19: qty 2

## 2024-01-19 MED ORDER — ENOXAPARIN SODIUM 40 MG/0.4ML IJ SOSY
40.0000 mg | PREFILLED_SYRINGE | INTRAMUSCULAR | Status: DC
  Administered 2024-01-20: 40 mg via SUBCUTANEOUS
  Filled 2024-01-19 (×2): qty 0.4

## 2024-01-19 MED ORDER — POTASSIUM CHLORIDE 20 MEQ PO PACK
20.0000 meq | PACK | ORAL | Status: DC
  Administered 2024-01-20: 20 meq via ORAL
  Filled 2024-01-19: qty 1

## 2024-01-19 MED ORDER — GADOBUTROL 1 MMOL/ML IV SOLN
5.0000 mL | Freq: Once | INTRAVENOUS | Status: AC | PRN
  Administered 2024-01-19: 5 mL via INTRAVENOUS

## 2024-01-19 MED ORDER — DOCUSATE SODIUM 100 MG PO CAPS
100.0000 mg | ORAL_CAPSULE | Freq: Two times a day (BID) | ORAL | Status: DC
  Administered 2024-01-19 – 2024-01-21 (×4): 100 mg via ORAL
  Filled 2024-01-19 (×4): qty 1

## 2024-01-19 MED ORDER — ACETAMINOPHEN 325 MG PO TABS
650.0000 mg | ORAL_TABLET | Freq: Three times a day (TID) | ORAL | Status: DC
  Administered 2024-01-19 – 2024-01-21 (×6): 650 mg via ORAL
  Filled 2024-01-19 (×6): qty 2

## 2024-01-19 NOTE — ED Provider Notes (Signed)
 " [facility] EMERGENCY DEPARTMENT AT [facility] Provider Note   CSN: [id number] Arrival date & time: 01/19/24  9096     Patient presents with: Hip Pain   Brett Leonard is a 89 y.o. male.   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  Patient is accompanied by family members who have healthcare power of attorney.  According to the patient, has been having right hip pain for the last 3 days.  Pain is radiating down his right groin, sometimes radiating all the way down to the foot.  Pain is described as sharp and throbbing with no specific evoking, aggravating or relieving factors.    Per family, patient has not walked in 3 days.  Normally he uses a cane to get around, but he is unable to ambulate independently now.  Patient feels that his right leg is heavy.  He has no new back pain.  Pt has no associated numbness, weakness, urinary incontinence, urinary retention, bowel incontinence, pins and needle sensation in the perineal area.   Prior to Admission medications  Medication Sig Start Date End Date Taking? Authorizing Provider  aspirin  EC 81 MG tablet Take 81 mg by mouth daily. Swallow whole.    [provider]  docusate sodium  (COLACE) 100 MG capsule Take 100 mg by mouth 2 (two) times daily.    [provider]  furosemide  (LASIX ) 40 MG tablet take 1 tablet by mouth once a week. 05/28/22   Okey Vina GAILS, MD  levothyroxine  (SYNTHROID ) 25 MCG tablet Take 25 mcg by mouth daily. 05/28/23   [provider]  potassium chloride  (KLOR-CON ) 10 MEQ tablet take 1 tablet (10 meq total) by mouth once a week. 09/09/22   Okey Vina GAILS, MD  rosuvastatin  (CRESTOR ) 20 MG tablet take 1 tablet once daily. 05/28/22   Okey Vina GAILS, MD  tamsulosin  (FLOMAX ) 0.4 MG CAPS capsule Take 0.4 mg by mouth in  the morning and at bedtime.    [provider]    Allergies: Propoxyphene n-acetaminophen     Review of Systems  All other systems reviewed and are negative.   Updated Vital Signs BP (!) 164/97 (BP Location: Left Arm)   Pulse (!) 59   Temp 97.9 F (36.6 C) (Oral)   Resp 16   Ht 5' 7 (1.702 m)   Wt 56.1 kg   SpO2 99%   BMI 19.37 kg/m   Physical Exam Vitals and nursing note reviewed.  Constitutional:      Appearance: He is well-developed.  HENT:     Head: Atraumatic.  Eyes:     Extraocular Movements: Extraocular movements intact.     Pupils: Pupils are equal, round, and reactive to light.  Cardiovascular:     Rate and Rhythm: Normal rate.  Pulmonary:     Effort: Pulmonary effort is normal.  Musculoskeletal:     Cervical back: Neck supple.  Skin:    General: Skin is warm.  Neurological:     Mental Status: He is alert and oriented to person, place, and time.     Sensory: No sensory deficit.     Motor: Weakness present.     Coordination: Coordination normal.     Gait: Gait abnormal.     Comments: Hyporeflexia noted in the right patella compared to the contralateral side, strength is  4 out of 5 bilateral upper and lower extremity, but patient appears to be putting in more effort in the lower leg and is unable to raise it as high as the left side     (all labs ordered are listed, but only abnormal results are displayed) Labs Reviewed  BASIC METABOLIC PANEL WITH GFR - Abnormal; Notable for the following components:      Result Value   Glucose, Bld 142 (*)    Calcium  8.6 (*)    All other components within normal limits  CBC WITH DIFFERENTIAL/PLATELET - Abnormal; Notable for the following components:   RBC 4.04 (*)    Hemoglobin 12.2 (*)    HCT 37.6 (*)    All other components within normal limits  URINALYSIS, ROUTINE W REFLEX MICROSCOPIC    EKG: None  Radiology: MR BRAIN WO CONTRAST Result Date: 01/19/2024 CLINICAL DATA:  Right leg weakness EXAM: MRI  HEAD WITHOUT CONTRAST TECHNIQUE: Multiplanar, multiecho pulse sequences of the brain and surrounding structures were obtained without intravenous contrast. COMPARISON:  None Available. FINDINGS: MRI brain: There is an old infarct in the posterior right frontal lobe with encephalomalacia. No acute infarct. The ventricles are normal. No mass lesion. There are normal flow signals in the carotid arteries and basilar artery. No significant bone marrow signal abnormality. No significant abnormality in the paranasal sinuses or soft tissues. IMPRESSION: Old right posterior frontal infarct with encephalomalacia. No acute infarct or other significant abnormality Electronically Signed   By: Nancyann Burns M.D.   On: 01/19/2024 11:14   CT Hip Right Wo Contrast Result Date: 01/19/2024 CLINICAL DATA:  Right hip pain three days.  Difficulty ambulating. EXAM: CT OF THE RIGHT HIP WITHOUT CONTRAST TECHNIQUE: Multidetector CT imaging of the right hip was performed according to the standard protocol. Multiplanar CT image reconstructions were also generated. RADIATION DOSE REDUCTION: This exam was performed according to the departmental dose-optimization program which includes automated exposure control, adjustment of the mA and/or kV according to patient size and/or use of iterative reconstruction technique. COMPARISON:  None Available. FINDINGS: Bones/Joint/Cartilage Mild osteoarthritic change of the right hip. No evidence of acute fracture or dislocation. No focal lytic or sclerotic lesion. Ligaments Suboptimally assessed by CT. Muscles and Tendons Unremarkable. Soft tissues No evidence right hip joint effusion. No soft tissue mass. Partially visualized radiation seed implants over the prostate gland. IMPRESSION: 1. No acute findings. 2. Mild osteoarthritic change of the right hip. Electronically Signed   By: Toribio Agreste M.D.   On: 01/19/2024 10:30   CT Lumbar Spine Wo Contrast Result Date: 01/19/2024 CLINICAL DATA:  Right hip  pain radiating 2 right foot with weakness. Difficulty ambulating. Symptoms 3 days. EXAM: CT LUMBAR SPINE WITHOUT CONTRAST TECHNIQUE: Multidetector CT imaging of the lumbar spine was performed without intravenous contrast administration. Multiplanar CT image reconstructions were also generated. RADIATION DOSE REDUCTION: This exam was performed according to the departmental dose-optimization program which includes automated exposure control, adjustment of the mA and/or kV according to patient size and/or use of iterative reconstruction technique. COMPARISON:  Lumbar spine series 09/11/2022 and report only of previous CT 09/11/2022. FINDINGS: Segmentation: Hypoplastic twelfth ribs with 5 lumbar type vertebrae. Alignment: Normal. Vertebrae: Mild to moderate spondylosis throughout the lumbar spine. Superior endplate compression deformity of L3 with approximate 25% height loss unchanged per report of previous CT 09/11/2022 and not significantly changed from previous plain films 2024. No new/acute compression fracture. Paraspinal and other soft tissues: Negative. Disc levels: L1-2 and L2-3 levels are unremarkable  without significant disc disease. L3-4 level demonstrates mild broad-based disc bulge. Vacuum disc phenomenon. Mild right-sided neural foraminal narrowing. Borderline canal stenosis which is multifactorial. L4-5 level demonstrates mild broad-based disc bulge. Vacuum disc phenomenon. Minimal bilateral neural foraminal narrowing and borderline canal stenosis. L5-S1 level demonstrates disc space narrowing without significant disc bulge. No spinal canal stenosis. Moderate left-sided neural foraminal narrowing. Degenerative changes of the sacroiliac joints. Calcified plaque over the abdominal aorta which is normal in caliber. IMPRESSION: 1. No acute findings. 2. Mild to moderate spondylosis throughout the lumbar spine with multilevel disc disease as described from the L3-4 level to the L5-S1 level. Borderline canal  stenosis at the L3-4 and L4-5 levels. Moderate left-sided neural foraminal narrowing at the L5-S1 level. 3. Chronic superior endplate compression deformity of L3 with approximate 25% height loss unchanged per report of previous CT 09/11/2022 and not significantly changed from previous plain films 2024. Electronically Signed   By: Toribio Agreste M.D.   On: 01/19/2024 10:24     Procedures   Medications Ordered in the ED  enoxaparin  (LOVENOX ) injection 40 mg (has no administration in time range)  acetaminophen  (TYLENOL ) tablet 650 mg (650 mg Oral Given 01/19/24 1455)    Or  acetaminophen  (TYLENOL ) suppository 650 mg ( Rectal See Alternative 01/19/24 1455)  senna-docusate (Senokot-S) tablet 1 tablet (has no administration in time range)  gadobutrol  (GADAVIST ) 1 MMOL/ML injection 5 mL (5 mLs Intravenous Contrast Given 01/19/24 1418)                                    Medical Decision Making Amount and/or Complexity of Data Reviewed Labs: ordered. Radiology: ordered.  Risk Prescription drug management. Decision regarding hospitalization.   This patient presents to the ED with chief complaint(s) of pelvic pain, difficulty with walking with pertinent past medical history of prostate cancer, CAD. The complaint involves an extensive differential diagnosis and also carries with it a high risk of complications and morbidity.    Additional history obtained from family. I have also reviewed cardiology notes.  The differential diagnosis considered includes : - Acute stroke - Metastatic disease - Degenerative disease of the back - Spondylitises/ spondylosis - Sciatica - Spinal cord compression / cord syndrome - Conus medullaris - Epidural hematoma - Epidural abscess - Lytic/pathologic fracture - Myelitis - Musculoskeletal pain  The initial management include getting basic labs, MRI of the brain and CT lumbar spine, pelvis.  Reassessments: The following labs were independently interpreted:  CBC, metabolic profile.  I independently visualized the following imaging with scope of interpretation limited to determining acute life threatening conditions related to emergency care: MRI of the brain, which revealed no evidence of acute stroke.  No evidence of acute bleed.  Treatment and Reassessment: Patient reassessed.  He continues to have difficulty moving his right lower extremity.  Family reports that he normally walks, just not comfortable taking him home this way.  Given that there has been acute change in his ability to walk, we likely need to do additional workup.  Will get MRI of the lumbar spine and admit him.  Patient will need PT-OT.   Final diagnoses:  Weakness of right lower extremity  Pain of right lower extremity    ED Discharge Orders     None          Charlyn Sora, MD 01/19/24 1513  "

## 2024-01-19 NOTE — H&P (Addendum)
 " History and Physical    Brett Leonard FMW:[id number] DOB: May 18, 1931 DOA: 01/19/2024  PCP: [facility]   Patient coming from: Home  I have personally briefly reviewed patient's old medical records in [facility]  Chief Complaint: Generalized weakness/leg weakness HPI: Brett Leonard is a 89 y.o. male with medical history significant of CAD, prostate cancer and stroke with residual left-sided numbness in the upper extremity comes in with chief complaint of hip pain as well as difficulty with ambulation.  Patient started having right groin pain and heaviness of right leg 3 days ago and has not been able to ambulate since then.  Normally is up and ambulatory, occasionally uses walker and cane.  He says his right leg is heavy.  Denies any back pain issues but does report of pain around the right hip that travels down.  Denies any trauma or fall.  Denies any heavy lifting or any inciting events.  Denies urinary incontinence, urinary retention or bowel incontinence.  No fever, chills, nausea, vomiting or diarrhea.  ED Course: Generalized weakness noted in the ER.  Patient unable to ambulate.  Brain MRI done was negative for acute stroke or other acute findings.  CT scan of the body spine negative for fracture, shows foraminal stenosis.  MRI of the lumbar spine with IV contrast revealed multilevel disc bulge.  No high-grade stenosis or any signs of cauda equina nerve enhancement.  L3-L4 right disc extrusion contacts the exiting right L3 nerve root and contributes to moderate right neural foraminal stenosis.  Associated edematous changes involve the adjacent L3-L4 endplates.  hospitalist service was called to evaluate the patient.  Review of Systems: As per HPI otherwise all other systems were reviewed and are negative.   Past Medical History:  Diagnosis Date   Body mass index (BMI) of 20.0-20.9 in adult    Cancer [facility])    Prostate   Hypertension    Palpitations    Renal  insufficiency     Past Surgical History:  Procedure Laterality Date   CORONARY ARTERY BYPASS GRAFT  10/24/2009     reports that he has never smoked. He has never used smokeless tobacco. He reports that he does not drink alcohol  and does not use drugs.  Allergies[1]  Family History  Problem Relation Age of Onset   Diabetes Mother    Glaucoma Mother    Cancer Father     Prior to Admission medications  Medication Sig Start Date End Date Taking? Authorizing Provider  aspirin  EC 81 MG tablet Take 81 mg by mouth daily. Swallow whole.    [provider]  docusate sodium  (COLACE) 100 MG capsule Take 100 mg by mouth 2 (two) times daily.    [provider]  furosemide  (LASIX ) 40 MG tablet take 1 tablet by mouth once a week. 05/28/22   Okey Vina GAILS, MD  levothyroxine  (SYNTHROID ) 25 MCG tablet Take 25 mcg by mouth daily. 05/28/23   [provider]  potassium chloride  (KLOR-CON ) 10 MEQ tablet take 1 tablet (10 meq total) by mouth once a week. 09/09/22   Okey Vina GAILS, MD  rosuvastatin  (CRESTOR ) 20 MG tablet take 1 tablet once daily. 05/28/22   Okey Vina GAILS, MD  tamsulosin  (FLOMAX ) 0.4 MG CAPS capsule Take 0.4 mg by mouth in the morning and at bedtime.    [provider]    Physical Exam: Vitals:   01/19/24 1115 01/19/24 1130 01/19/24 1145 01/19/24 1446  BP: (!) 143/57 126/62 135/61 ROLLEN)  164/97  Pulse: 63 60 (!) 57 (!) 59  Resp:    16  Temp:    97.9 F (36.6 C)  TempSrc:    Oral  SpO2: 100% 100% 100% 99%  Weight:      Height:        Constitutional: NAD, calm, comfortable Vitals:   01/19/24 1115 01/19/24 1130 01/19/24 1145 01/19/24 1446  BP: (!) 143/57 126/62 135/61 (!) 164/97  Pulse: 63 60 (!) 57 (!) 59  Resp:    16  Temp:    97.9 F (36.6 C)  TempSrc:    Oral  SpO2: 100% 100% 100% 99%  Weight:      Height:         Labs on Admission: I have personally reviewed following labs and imaging studies  CBC: Recent Labs  Lab 01/19/24 1106   WBC 8.4  NEUTROABS 5.2  HGB 12.2*  HCT 37.6*  MCV 93.1  PLT 155   Basic Metabolic Panel: Recent Labs  Lab 01/19/24 1106  NA 141  K 3.6  CL 106  CO2 28  GLUCOSE 142*  BUN 21  CREATININE 0.97  CALCIUM  8.6*   GFR: Estimated Creatinine Clearance: 40.2 mL/min (by C-G formula based on SCr of 0.97 mg/dL). Liver Function Tests: No results for input(s): AST, ALT, ALKPHOS, BILITOT, PROT, ALBUMIN in the last 168 hours. No results for input(s): LIPASE, AMYLASE in the last 168 hours. No results for input(s): AMMONIA in the last 168 hours. Coagulation Profile: No results for input(s): INR, PROTIME in the last 168 hours. Cardiac Enzymes: No results for input(s): CKTOTAL, CKMB, CKMBINDEX, TROPONINI in the last 168 hours. BNP (last 3 results) No results for input(s): PROBNP in the last 8760 hours. HbA1C: No results for input(s): HGBA1C in the last 72 hours. CBG: No results for input(s): GLUCAP in the last 168 hours. Lipid Profile: No results for input(s): CHOL, HDL, LDLCALC, TRIG, CHOLHDL, LDLDIRECT in the last 72 hours. Thyroid  Function Tests: No results for input(s): TSH, T4TOTAL, FREET4, T3FREE, THYROIDAB in the last 72 hours. Anemia Panel: No results for input(s): VITAMINB12, FOLATE, FERRITIN, TIBC, IRON, RETICCTPCT in the last 72 hours. Urine analysis:    Component Value Date/Time   COLORURINE YELLOW 01/19/2024 1207   APPEARANCEUR CLEAR 01/19/2024 1207   LABSPEC 1.025 01/19/2024 1207   PHURINE 5.0 01/19/2024 1207   GLUCOSEU NEGATIVE 01/19/2024 1207   HGBUR NEGATIVE 01/19/2024 1207   BILIRUBINUR NEGATIVE 01/19/2024 1207   KETONESUR NEGATIVE 01/19/2024 1207   PROTEINUR NEGATIVE 01/19/2024 1207   UROBILINOGEN 0.2 10/24/2009 0614   NITRITE NEGATIVE 01/19/2024 1207   LEUKOCYTESUR NEGATIVE 01/19/2024 1207    Radiological Exams on Admission: MR Lumbar Spine W Wo Contrast Result Date: 01/19/2024 EXAM: MRI  LUMBAR SPINE 01/19/2024 02:17:10 PM TECHNIQUE: Multiplanar multisequence MRI of the lumbar spine was performed with and without the administration of intravenous contrast. 5 mL (gadobutrol  (GADAVIST ) 1 MMOL/ML injection 5 mL GADOBUTROL  1 MMOL/ML IV SOLN). COMPARISON: CT lumbar spine 01/19/2024 and lumbar spine radiographs 09/11/2022. CLINICAL HISTORY: Bone mass or bone pain, lumbar spine, aggressive features on xray. FINDINGS: BONES AND ALIGNMENT: Trace retrolisthesis of L1 on L2, similar to prior exam. Bone marrow signal is unremarkable. Chronic superior L3 endplate deformity with approximately 25% height loss centrally. Remaining normal vertebral body heights. There are edematous endplate signal changes and enhancement at the posterior right L3 greater than L4 endplates with an associated small Schmorl's node. Trace edema signal involving the superior L5 endplate on the right. There is  multilevel intervertebral disc height and signal loss. SPINAL CORD: The conus medullaris terminates at the L1 level. No abnormal intradural, conus medullaris, or cauda equina nerve root enhancement. SOFT TISSUES: No paraspinal mass. L1-L2: Disc bulge and mild bilateral facet arthropathy without significant spinal stenosis. There is mild right greater than left bilateral neural foraminal stenosis. L2-L3: There is disc bulge and mild bilateral facet arthropathy with minimal right lateral recess stenosis. There is mild right and minimal left neural foraminal stenosis. L3-L4: There is a right lateral recess-to-foraminal zone disc extrusion approximating the descending right L4 nerve root and contacting the exiting right L3 nerve root, contributing to moderate right neural foraminal stenosis. Mild bilateral facet arthropathy. No significant spinal canal stenosis. Mild left neuroforaminal narrowing. L4-L5: There is disc bulge and moderate bilateral facet arthropathy without significant spinal stenosis. Mild bilateral neural foraminal  narrowing. L5-S1: There is left eccentric disc bulge without significant spinal stenosis. Mild bilateral facet arthropathy. There is no significant right and mild-to-moderate left neural foraminal stenosis. IMPRESSION: 1. L3-L4: Right disc extrusion contacts the exiting right L3 nerve root and contributes to moderate right neural foraminal stenosis. Disc material approximates the descending right L4 nerve root. Associated edematous changes involve the adjacent L3-L4 endplates 2. No high-grade spinal stenosis. 3. No abnormal intradural, conus medullaris, or cauda equina nerve root enhancement. Electronically signed by: Prentice Spade MD 01/19/2024 04:01 PM EST RP Workstation: [id number]   MR BRAIN WO CONTRAST Result Date: 01/19/2024 CLINICAL DATA:  Right leg weakness EXAM: MRI HEAD WITHOUT CONTRAST TECHNIQUE: Multiplanar, multiecho pulse sequences of the brain and surrounding structures were obtained without intravenous contrast. COMPARISON:  None Available. FINDINGS: MRI brain: There is an old infarct in the posterior right frontal lobe with encephalomalacia. No acute infarct. The ventricles are normal. No mass lesion. There are normal flow signals in the carotid arteries and basilar artery. No significant bone marrow signal abnormality. No significant abnormality in the paranasal sinuses or soft tissues. IMPRESSION: Old right posterior frontal infarct with encephalomalacia. No acute infarct or other significant abnormality Electronically Signed   By: Nancyann Burns M.D.   On: 01/19/2024 11:14   CT Hip Right Wo Contrast Result Date: 01/19/2024 CLINICAL DATA:  Right hip pain three days.  Difficulty ambulating. EXAM: CT OF THE RIGHT HIP WITHOUT CONTRAST TECHNIQUE: Multidetector CT imaging of the right hip was performed according to the standard protocol. Multiplanar CT image reconstructions were also generated. RADIATION DOSE REDUCTION: This exam was performed according to the departmental dose-optimization program  which includes automated exposure control, adjustment of the mA and/or kV according to patient size and/or use of iterative reconstruction technique. COMPARISON:  None Available. FINDINGS: Bones/Joint/Cartilage Mild osteoarthritic change of the right hip. No evidence of acute fracture or dislocation. No focal lytic or sclerotic lesion. Ligaments Suboptimally assessed by CT. Muscles and Tendons Unremarkable. Soft tissues No evidence right hip joint effusion. No soft tissue mass. Partially visualized radiation seed implants over the prostate gland. IMPRESSION: 1. No acute findings. 2. Mild osteoarthritic change of the right hip. Electronically Signed   By: Toribio Agreste M.D.   On: 01/19/2024 10:30   CT Lumbar Spine Wo Contrast Result Date: 01/19/2024 CLINICAL DATA:  Right hip pain radiating 2 right foot with weakness. Difficulty ambulating. Symptoms 3 days. EXAM: CT LUMBAR SPINE WITHOUT CONTRAST TECHNIQUE: Multidetector CT imaging of the lumbar spine was performed without intravenous contrast administration. Multiplanar CT image reconstructions were also generated. RADIATION DOSE REDUCTION: This exam was performed according to the departmental dose-optimization program  which includes automated exposure control, adjustment of the mA and/or kV according to patient size and/or use of iterative reconstruction technique. COMPARISON:  Lumbar spine series 09/11/2022 and report only of previous CT 09/11/2022. FINDINGS: Segmentation: Hypoplastic twelfth ribs with 5 lumbar type vertebrae. Alignment: Normal. Vertebrae: Mild to moderate spondylosis throughout the lumbar spine. Superior endplate compression deformity of L3 with approximate 25% height loss unchanged per report of previous CT 09/11/2022 and not significantly changed from previous plain films 2024. No new/acute compression fracture. Paraspinal and other soft tissues: Negative. Disc levels: L1-2 and L2-3 levels are unremarkable without significant disc disease. L3-4  level demonstrates mild broad-based disc bulge. Vacuum disc phenomenon. Mild right-sided neural foraminal narrowing. Borderline canal stenosis which is multifactorial. L4-5 level demonstrates mild broad-based disc bulge. Vacuum disc phenomenon. Minimal bilateral neural foraminal narrowing and borderline canal stenosis. L5-S1 level demonstrates disc space narrowing without significant disc bulge. No spinal canal stenosis. Moderate left-sided neural foraminal narrowing. Degenerative changes of the sacroiliac joints. Calcified plaque over the abdominal aorta which is normal in caliber. IMPRESSION: 1. No acute findings. 2. Mild to moderate spondylosis throughout the lumbar spine with multilevel disc disease as described from the L3-4 level to the L5-S1 level. Borderline canal stenosis at the L3-4 and L4-5 levels. Moderate left-sided neural foraminal narrowing at the L5-S1 level. 3. Chronic superior endplate compression deformity of L3 with approximate 25% height loss unchanged per report of previous CT 09/11/2022 and not significantly changed from previous plain films 2024. Electronically Signed   By: Toribio Agreste M.D.   On: 01/19/2024 10:24    EKG: Independently reviewed.   Assessment/Plan  Right leg weakness/radicular symptoms: -MRI lumbar spine shows multilevel disc disease, L3-L4 right disc extrusion contacts the exiting L4 nerve root, no significant stenosis.  Some edema. - Will start gabapentin  100 mg 3 times daily.  Will also start Decadron  to see if that helps - PT/OT  History of stroke - Continue aspirin , statin  History of CABG/CAD - Continue aspirin , statin  Hypothyroidism - Continue levothyroxine   Hyperlipidemia - Continue statins  BPH: Continue tamsulosin    DVT prophylaxis: Lovenox  Code Status: DNR/DNI  family Communication: N discussed with son and daughter at the bedside disposition Plan: TBD Consults called:  Admission status: obs   Severity of Illness: The appropriate  patient status for this patient is OBSERVATION. Observation status is judged to be reasonable and necessary in order to provide the required intensity of service to ensure the patient's safety. The patient's presenting symptoms, physical exam findings, and initial radiographic and laboratory data in the context of their medical condition is felt to place them at decreased risk for further clinical deterioration. Furthermore, it is anticipated that the patient will be medically stable for discharge from the hospital within 2 midnights of admission.     Derryl Duval MD [facility]  01/19/2024, 5:37 PM         [1]  Allergies Allergen Reactions   Propoxyphene N-Acetaminophen      REACTION: hallucinations   "

## 2024-01-19 NOTE — Evaluation (Signed)
 Physical Therapy Evaluation Patient Details Name: Brett Leonard MRN: [id number] DOB: 12-03-1931 Today's Date: 01/19/2024  History of Present Illness  89 year old patient with history of coronary artery disease, prostate cancer and stroke with residual left-sided numbness in the upper extremity comes in with chief complaint of hip pain, difficulty in walking.  Patient is accompanied by family members who have healthcare power of attorney.     According to the patient, has been having right hip pain for the last 3 days.  Pain is radiating down his right groin, sometimes radiating all the way down to the foot.  Pain is described as sharp and throbbing with no specific evoking, aggravating or relieving factors.       Per family, patient has not walked in 3 days.  Normally he uses a cane to get around, but he is unable to ambulate independently now.  Patient feels that his right leg is heavy.  He has no new back pain.     Pt has no associated numbness, weakness, urinary incontinence, urinary retention, bowel incontinence, pins and needle sensation in the perineal area.   Clinical Impression  Patient demonstrates significant pain in low back and radiating pain down the RLE, decreased RLE strength/mobility, abnormal pain with functional mobility, and impaired balance. Patient also demonstrates need for mod/max asssist for bed mobility, functional transfers and ambulation with RW due to the pain. Patient likely experiencing pain caused by compression of L3 nerve root on R side per MRI impression. Patient requires education on role of PT, prognosis, overall POC and repositioning techniques. Patient would benefit from skilled physical therapy for decreased low back pain, increased endurance with ambulation, increased RLE/core strength, and balance for improved gait quality, return to higher level of function with ADLs, and progress towards therapy goals.         If plan is discharge home, recommend the  following: Two people to help with walking and/or transfers;Two people to help with bathing/dressing/bathroom;Assistance with feeding;Assistance with cooking/housework;Direct supervision/assist for medications management;Help with stairs or ramp for entrance;Assist for transportation   Can travel by private vehicle        Equipment Recommendations Other (comment) (pt will need assistance thorughout the day should he be discharged home.)  Recommendations for Other Services       Functional Status Assessment Patient has had a recent decline in their functional status and demonstrates the ability to make significant improvements in function in a reasonable and predictable amount of time.     Precautions / Restrictions Precautions Precautions: Fall Recall of Precautions/Restrictions: Intact Restrictions Weight Bearing Restrictions Per Provider Order: No      Mobility  Bed Mobility Overal bed mobility: Needs Assistance Bed Mobility: Supine to Sit     Supine to sit: Mod assist          Transfers Overall transfer level: Needs assistance Equipment used: Rolling walker (2 wheels) Transfers: Sit to/from Stand, Bed to chair/wheelchair/BSC Sit to Stand: Mod assist, Max assist                Ambulation/Gait Ambulation/Gait assistance: Mod assist Gait Distance (Feet): 15 Feet Assistive device: Rolling walker (2 wheels) Gait Pattern/deviations: Step-to pattern, Decreased step length - right, Shuffle, Wide base of support, Antalgic, Decreased stride length Gait velocity: decreased     General Gait Details: antalgic  Stairs            Wheelchair Mobility     Tilt Bed    Modified Rankin (Stroke Patients Only)  Balance Overall balance assessment: Needs assistance Sitting-balance support: Bilateral upper extremity supported Sitting balance-Leahy Scale: Fair     Standing balance support: Bilateral upper extremity supported Standing balance-Leahy Scale:  Fair                               Pertinent Vitals/Pain Pain Assessment Pain Assessment: Faces Faces Pain Scale: Hurts worst Pain Location: low back and RLE (proximal) Pain Descriptors / Indicators: Grimacing, Heaviness, Pressure, Radiating, Stabbing, Shooting, Sharp Pain Intervention(s): Limited activity within patient's tolerance, Repositioned    Home Living Family/patient expects to be discharged to:: Private residence Living Arrangements: Alone Available Help at Discharge: Family;Available PRN/intermittently Type of Home: House Home Access: Ramped entrance       Home Layout: One level Home Equipment: Agricultural Consultant (2 wheels);Cane - single point;Shower seat;Grab bars - tub/shower      Prior Function Prior Level of Function : Independent/Modified Independent             Mobility Comments: modified independent with community ambulation drives on weekends ADLs Comments: children help as needed     Extremity/Trunk Assessment   Upper Extremity Assessment Upper Extremity Assessment: Overall WFL for tasks assessed    Lower Extremity Assessment Lower Extremity Assessment: Generalized weakness;RLE deficits/detail RLE Deficits / Details: RLE deficits apparent with bed mobility, functional transfers, and ambulation due to pain    Cervical / Trunk Assessment Cervical / Trunk Assessment: Kyphotic  Communication   Communication Communication: Impaired Factors Affecting Communication: Hearing impaired    Cognition Arousal: Alert Behavior During Therapy: WFL for tasks assessed/performed   PT - Cognitive impairments: No apparent impairments                         Following commands: Intact       Cueing Cueing Techniques: Verbal cues     General Comments      Exercises     Assessment/Plan    PT Assessment Patient needs continued PT services  PT Problem List Decreased strength;Decreased balance;Pain;Decreased range of  motion;Decreased mobility;Decreased activity tolerance;Impaired sensation       PT Treatment Interventions DME instruction;Gait training;Stair training;Functional mobility training;Neuromuscular re-education;Balance training;Therapeutic exercise;Patient/family education;Therapeutic activities    PT Goals (Current goals can be found in the Care Plan section)  Acute Rehab PT Goals Patient Stated Goal: to decrease the pain and get better before going home PT Goal Formulation: With patient/family Time For Goal Achievement: 02/02/24 Potential to Achieve Goals: Good    Frequency Min 3X/week     Co-evaluation               AM-PAC PT 6 Clicks Mobility  Outcome Measure Help needed turning from your back to your side while in a flat bed without using bedrails?: A Lot Help needed moving from lying on your back to sitting on the side of a flat bed without using bedrails?: A Lot Help needed moving to and from a bed to a chair (including a wheelchair)?: A Lot Help needed standing up from a chair using your arms (e.g., wheelchair or bedside chair)?: A Little Help needed to walk in hospital room?: A Little Help needed climbing 3-5 steps with a railing? : A Lot 6 Click Score: 14    End of Session Equipment Utilized During Treatment: Gait belt Activity Tolerance: Patient limited by pain Patient left: in chair;with call bell/phone within reach;with family/visitor present Nurse Communication: Mobility status;Precautions;Patient  requests pain meds PT Visit Diagnosis: Unsteadiness on feet (R26.81);Other abnormalities of gait and mobility (R26.89);History of falling (Z91.81);Muscle weakness (generalized) (M62.81);Difficulty in walking, not elsewhere classified (R26.2);Pain Pain - Right/Left: Right Pain - part of body: Leg    Time:  -      Charges:   PT Evaluation $PT Eval Low Complexity: 1 Low   PT General Charges $$ ACUTE PT VISIT: 1 Visit         Lang Ada, PT, DPT [facility] Office: [phone] 5:11 PM, 01/19/2024

## 2024-01-19 NOTE — Plan of Care (Signed)
" °  Problem: Acute Rehab PT Goals(only PT should resolve) Goal: Pt Will Go Supine/Side To Sit Outcome: Progressing Flowsheets (Taken 01/19/2024 1712) Pt will go Supine/Side to Sit: with minimal assist Goal: Patient Will Transfer Sit To/From Stand Outcome: Progressing Flowsheets (Taken 01/19/2024 1712) Patient will transfer sit to/from stand: with contact guard assist Goal: Pt Will Transfer Bed To Chair/Chair To Bed Outcome: Progressing Flowsheets (Taken 01/19/2024 1712) Pt will Transfer Bed to Chair/Chair to Bed: with min assist Goal: Pt Will Ambulate Outcome: Progressing Flowsheets (Taken 01/19/2024 1712) Pt will Ambulate:  with minimal assist  25 feet  with rolling walker    Lang Ada, PT, DPT [facility] Office: [phone] 5:13 PM, 01/19/2024  "

## 2024-01-19 NOTE — ED Triage Notes (Signed)
 Pt presented to ED via POV. Pt arrived due to having hip pain in his right hip for the past three days. Pt states that he will wake up in the middle of the night screaming and crying from the pain. He states that the pain sometimes spasms and other times is constant. Pt states that he rates the pain 10/10 and is unable to ambulate. Upon visualization of site, he has no deformities or bruising noted. Denies falls and doesn't know what's causing all of this to happen.

## 2024-01-19 NOTE — Plan of Care (Signed)

## 2024-01-20 ENCOUNTER — Encounter (HOSPITAL_COMMUNITY): Payer: Self-pay | Admitting: Hospitalist

## 2024-01-20 DIAGNOSIS — R29898 Other symptoms and signs involving the musculoskeletal system: Secondary | ICD-10-CM | POA: Diagnosis not present

## 2024-01-20 MED ORDER — TAMSULOSIN HCL 0.4 MG PO CAPS
0.4000 mg | ORAL_CAPSULE | Freq: Two times a day (BID) | ORAL | Status: DC
  Administered 2024-01-21: 0.4 mg via ORAL
  Filled 2024-01-20: qty 1

## 2024-01-20 NOTE — Plan of Care (Signed)

## 2024-01-20 NOTE — Care Management Obs Status (Signed)
 MEDICARE OBSERVATION STATUS NOTIFICATION   Patient Details  Name: Brett Leonard MRN: [id number] Date of Birth: 04-08-1931   Medicare Observation Status Notification Given:  Yes    [name] Ada 01/20/2024, 4:38 PM

## 2024-01-20 NOTE — TOC Progression Note (Signed)
 Transition of Care [facility]) - Progression Note    Patient Details  Name: OLAF MESA MRN: [id number] Date of Birth: July 22, 1931  Transition of Care [facility]) CM/SW Contact  Hoy DELENA Bigness, LCSW Phone Number: 01/20/2024, 12:10 PM  Clinical Narrative:    Daughter no longer wanting pt to go to [facility] for SNF. Daughter agreeable to [facility]. [facility] able to extend bed offer. Insurance auth pending.    Expected Discharge Plan: Skilled Nursing Facility Barriers to Discharge: Continued Medical Work up               Expected Discharge Plan and Services In-house Referral: Clinical Social Work Discharge Planning Services: CM Consult Post Acute Care Choice: Skilled Nursing Facility Living arrangements for the past 2 months: Single Family Home                                       Social Drivers of Health (SDOH) Interventions SDOH Screenings   Food Insecurity: No Food Insecurity (01/19/2024)  Housing: Low Risk (01/19/2024)  Transportation Needs: No Transportation Needs (01/19/2024)  Utilities: Not At Risk (01/19/2024)  Social Connections: Socially Integrated (01/19/2024)  Tobacco Use: Low Risk (01/20/2024)    Readmission Risk Interventions     No data to display

## 2024-01-20 NOTE — Progress Notes (Signed)
 Mobility Specialist Progress Note:    01/20/24 0847  Mobility  Activity Ambulated with assistance  Level of Assistance Minimal assist, patient does 75% or more  Assistive Device Front wheel walker  Distance Ambulated (ft) 10 ft  Range of Motion/Exercises Active;All extremities  Activity Response Tolerated well  Mobility Referral Yes  Mobility visit 1 Mobility  Mobility Specialist Start Time (ACUTE ONLY) 0847  Mobility Specialist Stop Time (ACUTE ONLY) 0905  Mobility Specialist Time Calculation (min) (ACUTE ONLY) 18 min   Pt received in bed, requesting assistance to bathroom. Required MinA to stand and ambulate with RW. Tolerated well, c/o right hip pain. Left in bathroom, NT and family in room. All needs met.  Lyndsey Demos Mobility Specialist Please contact via Special Educational Needs Teacher or  Rehab office at [phone]

## 2024-01-20 NOTE — TOC Initial Note (Addendum)
 Transition of Care [facility]) - Initial/Assessment Note    Patient Details  Name: Brett Leonard MRN: [id number] Date of Birth: July 18, 1931  Transition of Care [facility]) CM/SW Contact:    Lucie Lunger, LCSWA Phone Number: 01/20/2024, 10:25 AM  Clinical Narrative:                 TOC updated that PT is recommending SNF for pt at D/C. CSW spoke to pts daughter who confirms they are interested in SNF and prefer [facility] as pts wife is currently there as well. CSW completed referral and sent out for review. TOC to follow.   Addendum: Call back received from daughter who states pts wife is at [facility] and they would like pt there not at [facility] if possible. Referral sent. TOC to follow.   Addendum: CSW updated that [facility] is not in network with pts INS. Daughter updated and agreeable to SNF at [facility]. Insurance auth pending for SNF at this time.   Expected Discharge Plan: Skilled Nursing Facility Barriers to Discharge: Continued Medical Work up   Patient Goals and CMS Choice Patient states their goals for this hospitalization and ongoing recovery are:: get stronger CMS Medicare.gov Compare Post Acute Care list provided to:: Patient Choice offered to / list presented to : Patient      Expected Discharge Plan and Services In-house Referral: Clinical Social Work Discharge Planning Services: CM Consult Post Acute Care Choice: Skilled Nursing Facility Living arrangements for the past 2 months: Single Family Home                                      Prior Living Arrangements/Services Living arrangements for the past 2 months: Single Family Home Lives with:: Self, Spouse Patient language and need for interpreter reviewed:: Yes Do you feel safe going back to the place where you live?: Yes      Need for Family Participation in Patient Care: Yes (Comment) Care giver support system in place?: Yes (comment)   Criminal Activity/Legal Involvement Pertinent to Current  Situation/Hospitalization: No - Comment as needed  Activities of Daily Living   ADL Screening (condition at time of admission) Independently performs ADLs?: No Does the patient have a NEW difficulty with bathing/dressing/toileting/self-feeding that is expected to last >3 days?: Yes (Initiates electronic notice to provider for possible OT consult) Does the patient have a NEW difficulty with getting in/out of bed, walking, or climbing stairs that is expected to last >3 days?: Yes (Initiates electronic notice to provider for possible PT consult) Does the patient have a NEW difficulty with communication that is expected to last >3 days?: No Is the patient deaf or have difficulty hearing?: Yes Does the patient have difficulty seeing, even when wearing glasses/contacts?: No Does the patient have difficulty concentrating, remembering, or making decisions?: No  Permission Sought/Granted                  Emotional Assessment Appearance:: Appears stated age Attitude/Demeanor/Rapport: Engaged Affect (typically observed): Accepting   Alcohol  / Substance Use: Not Applicable Psych Involvement: No (comment)  Admission diagnosis:  Weakness of right lower extremity [R29.898] Pain of right lower extremity [M79.604] Weakness of lower extremity [R29.898] Patient Active Problem List   Diagnosis Date Noted   Weakness of lower extremity 01/19/2024   DYSLIPIDEMIA 11/26/2009   Coronary atherosclerosis 11/26/2009   Cerebral artery occlusion with cerebral infarction (HCC) 11/26/2009  Pleural effusion 11/26/2009   PCP:  [facility]:   [facility] - [place], [place] - [address] [place] [address] Phone: [phone] Fax: [phone]     Social Drivers of Health (SDOH) Social History: SDOH Screenings   Food Insecurity: No Food Insecurity (01/19/2024)  Housing: Low Risk (01/19/2024)  Transportation Needs: No Transportation Needs (01/19/2024)   Utilities: Not At Risk (01/19/2024)  Social Connections: Socially Integrated (01/19/2024)  Tobacco Use: Low Risk (07/14/2023)   SDOH Interventions:     Readmission Risk Interventions     No data to display

## 2024-01-20 NOTE — Plan of Care (Signed)
  Problem: Nutrition: Goal: Adequate nutrition will be maintained Outcome: Progressing   Problem: Coping: Goal: Level of anxiety will decrease Outcome: Progressing   Problem: Pain Managment: Goal: General experience of comfort will improve and/or be controlled Outcome: Progressing

## 2024-01-20 NOTE — NC FL2 (Signed)
 " [place]  MEDICAID FL2 LEVEL OF CARE FORM     IDENTIFICATION  Patient Name: Brett Leonard Birthdate: 09-03-1931 Sex: male Admission Date (Current Location): 01/19/2024  [facility] and [place] Number:  [employer] and Address:  [facility],  [address] [place] [address]      Provider Number: [id number]  Attending Physician Name and Address:  Dino Antu, MD  Relative Name and Phone Number:       Current Level of Care: Hospital Recommended Level of Care: Skilled Nursing Facility Prior Approval Number:    Date Approved/Denied:   PASRR Number:    Discharge Plan: SNF    Current Diagnoses: Patient Active Problem List   Diagnosis Date Noted   Weakness of lower extremity 01/19/2024   DYSLIPIDEMIA 11/26/2009   Coronary atherosclerosis 11/26/2009   Cerebral artery occlusion with cerebral infarction (HCC) 11/26/2009   Pleural effusion 11/26/2009    Orientation RESPIRATION BLADDER Height & Weight     Self, Time, Situation, Place  Normal Continent Weight: 123 lb 10.9 oz (56.1 kg) Height:  5' 7 (170.2 cm)  BEHAVIORAL SYMPTOMS/MOOD NEUROLOGICAL BOWEL NUTRITION STATUS      Continent Diet (Regular)  AMBULATORY STATUS COMMUNICATION OF NEEDS Skin   Extensive Assist Verbally Normal                       Personal Care Assistance Level of Assistance  Bathing, Feeding, Dressing Bathing Assistance: Limited assistance Feeding assistance: Independent Dressing Assistance: Limited assistance     Functional Limitations Info  Sight, Hearing, Speech Sight Info: Impaired Hearing Info: Impaired Speech Info: Adequate    SPECIAL CARE FACTORS FREQUENCY  PT (By licensed PT), OT (By licensed OT)     PT Frequency: 5 times weekly OT Frequency: 5 times weekly            Contractures Contractures Info: Not present    Additional Factors Info  Code Status, Allergies Code Status Info: DNR-Limited Allergies Info: Propoxyphene N-acetaminophen             Current Medications (01/20/2024):  This is the current hospital active medication list Current Facility-Administered Medications  Medication Dose Route Frequency Provider Last Rate Last Admin   acetaminophen  (TYLENOL ) tablet 650 mg  650 mg Oral Q6H PRN Sigdel, Santosh, MD   650 mg at 01/19/24 1455   Or   acetaminophen  (TYLENOL ) suppository 650 mg  650 mg Rectal Q6H PRN Sigdel, Santosh, MD       acetaminophen  (TYLENOL ) tablet 650 mg  650 mg Oral Q8H Sigdel, Santosh, MD   650 mg at 01/20/24 0537   aspirin  EC tablet 81 mg  81 mg Oral Daily Sigdel, Santosh, MD   81 mg at 01/20/24 9078   dexamethasone  (DECADRON ) injection 4 mg  4 mg Intravenous Q8H Sigdel, Santosh, MD   4 mg at 01/20/24 9461   docusate sodium  (COLACE) capsule 100 mg  100 mg Oral BID Sigdel, Santosh, MD   100 mg at 01/20/24 9078   enoxaparin  (LOVENOX ) injection 40 mg  40 mg Subcutaneous Q24H Sigdel, Santosh, MD       gabapentin  (NEURONTIN ) capsule 100 mg  100 mg Oral TID Sigdel, Santosh, MD   100 mg at 01/20/24 9078   levothyroxine  (SYNTHROID ) tablet 25 mcg  25 mcg Oral Daily Sigdel, Santosh, MD   25 mcg at 01/20/24 0537   oxyCODONE  (Oxy IR/ROXICODONE ) immediate release tablet 5 mg  5 mg Oral Q6H PRN Mcarthur Pick, MD  5 mg at 01/19/24 2316   potassium chloride  (KLOR-CON ) packet 20 mEq  20 mEq Oral Weekly Sigdel, Santosh, MD   20 mEq at 01/20/24 9078   rosuvastatin  (CRESTOR ) tablet 20 mg  20 mg Oral Daily Sigdel, Santosh, MD   20 mg at 01/20/24 0919   senna-docusate (Senokot-S) tablet 1 tablet  1 tablet Oral QHS PRN Sigdel, Santosh, MD       [START ON 01/21/2024] tamsulosin  (FLOMAX ) capsule 0.4 mg  0.4 mg Oral BID Madueme, Elvira C, RPH         Discharge Medications: Please see discharge summary for a list of discharge medications.  Relevant Imaging Results:  Relevant Lab Results:   Additional Information SSN: [id number] [place]     "

## 2024-01-20 NOTE — Progress Notes (Addendum)
 " PROGRESS NOTE    Brett Leonard  FMW:[id number] DOB: [birth year] DOA: 01/19/2024 PCP: [facility]   Brief Narrative:    89 y.o. male with medical history significant of CAD, prostate cancer and stroke with residual left-sided numbness in the upper extremity comes in with chief complaint of hip pain as well as difficulty with ambulation.  Patient started having right groin pain and heaviness of right leg 3 days prior to admission.  He was living at home by himself.  MRI lumbar spine showed L3-L4 right disc extrusion contacts the exiting right L3 nerve root and contributes to moderate right neural foraminal stenosis.  Started on gabapentin .  PT recommended rehab.  Awaiting placement to skilled nursing facility.  Assessment & Plan:  Principal Problem:   Weakness of lower extremity   Right lower extremity weakness/radicular/neuropathy symptoms in the setting of L3-L4 degenerative disc disease leading to moderate right neural foraminal stenosis, POA: No high-grade spinal stenosis, No abnormal intradural, conus medullaris, or cauda equina nerve root enhancement. -No urgent neurosurgical intervention is needed. -PT on board, recommended short-term rehabilitation. - Continue with physical therapy - Continue gabapentin  100 mg p.o. thrice daily - Outpatient follow-up with neurosurgery  H/o Stroke, POA: Continue with aspirin  and statin.  History of CAD status post CABG, POA: Continue with aspirin  and statin.  No acute issues.  Hypothyroidism, POA: Continue Synthroid .  Hyperlipidemia, POA: Continue with statin  BPH, POA: Continue with tamsulosin   Disposition: Patient is coming from home where he was living by himself.  He will need placement to short-term rehabilitation.  Case management is working on it and PT is on board.   DVT prophylaxis: enoxaparin  (LOVENOX ) injection 40 mg Start: 01/19/24 2200 SCDs Start: 01/19/24 1332     Code Status: Limited: Do not attempt  resuscitation (DNR) -DNR-LIMITED -Do Not Intubate/DNI  Family Communication:  Daughter at the bedside Status is: Observation The patient remains OBS appropriate and will d/c before 2 midnights.    Subjective:  He said that his right hip/thigh pain is better today.  I spoke to his daughter, Brett Leonard at the bedside and she said that his wife is currently at [facility] rehab and he would prefer to go there on discharge.  Examination:  General exam: Appears calm and comfortable  Respiratory system: Clear to auscultation. Respiratory effort normal. Cardiovascular system: S1 & S2 heard, RRR. No JVD, murmurs, rubs, gallops or clicks. No pedal edema. Gastrointestinal system: Abdomen is nondistended, soft and nontender. No organomegaly or masses felt. Normal bowel sounds heard. Central nervous system: Alert and oriented. No focal neurological deficits. Extremities: Slightly reduced strength of the right lower extremity Skin: No rashes, lesions or ulcers Psychiatry: Judgement and insight appear normal. Mood & affect appropriate.    Diet Orders (From admission, onward)     Start     Ordered   01/19/24 1333  Diet regular Room service appropriate? Yes; Fluid consistency: Thin  Diet effective now       Question Answer Comment  Room service appropriate? Yes   Fluid consistency: Thin      01/19/24 1333            Objective: Vitals:   01/19/24 1446 01/19/24 1847 01/19/24 2113 01/20/24 0214  BP: (!) 164/97 (!) 152/62 (!) 162/83 137/63  Pulse: (!) 59 63 67 66  Resp: 16 17 18 18   Temp: 97.9 F (36.6 C)  97.6 F (36.4 C) 97.6 F (36.4 C)  TempSrc: Oral  Oral Oral  SpO2: 99%  97% 99% 96%  Weight:      Height:        Intake/Output Summary (Last 24 hours) at 01/20/2024 1025 Last data filed at 01/19/2024 1828 Gross per 24 hour  Intake 240 ml  Output --  Net 240 ml   Filed Weights   01/19/24 0924  Weight: 56.1 kg    Scheduled Meds:  acetaminophen   650 mg Oral Q8H   aspirin  EC  81 mg  Oral Daily   dexamethasone  (DECADRON ) injection  4 mg Intravenous Q8H   docusate sodium   100 mg Oral BID   enoxaparin  (LOVENOX ) injection  40 mg Subcutaneous Q24H   gabapentin   100 mg Oral TID   levothyroxine   25 mcg Oral Daily   potassium chloride   20 mEq Oral Weekly   rosuvastatin   20 mg Oral Daily   [START ON 01/21/2024] tamsulosin   0.4 mg Oral BID   Continuous Infusions:  Nutritional status     Body mass index is 19.37 kg/m.  Data Reviewed:   CBC: Recent Labs  Lab 01/19/24 1106  WBC 8.4  NEUTROABS 5.2  HGB 12.2*  HCT 37.6*  MCV 93.1  PLT 155   Basic Metabolic Panel: Recent Labs  Lab 01/19/24 1106  NA 141  K 3.6  CL 106  CO2 28  GLUCOSE 142*  BUN 21  CREATININE 0.97  CALCIUM  8.6*   GFR: Estimated Creatinine Clearance: 40.2 mL/min (by C-G formula based on SCr of 0.97 mg/dL). Liver Function Tests: No results for input(s): AST, ALT, ALKPHOS, BILITOT, PROT, ALBUMIN in the last 168 hours. No results for input(s): LIPASE, AMYLASE in the last 168 hours. No results for input(s): AMMONIA in the last 168 hours. Coagulation Profile: No results for input(s): INR, PROTIME in the last 168 hours. Cardiac Enzymes: No results for input(s): CKTOTAL, CKMB, CKMBINDEX, TROPONINI in the last 168 hours. BNP (last 3 results) No results for input(s): PROBNP in the last 8760 hours. HbA1C: No results for input(s): HGBA1C in the last 72 hours. CBG: No results for input(s): GLUCAP in the last 168 hours. Lipid Profile: No results for input(s): CHOL, HDL, LDLCALC, TRIG, CHOLHDL, LDLDIRECT in the last 72 hours. Thyroid  Function Tests: No results for input(s): TSH, T4TOTAL, FREET4, T3FREE, THYROIDAB in the last 72 hours. Anemia Panel: No results for input(s): VITAMINB12, FOLATE, FERRITIN, TIBC, IRON, RETICCTPCT in the last 72 hours. Sepsis Labs: No results for input(s): PROCALCITON, LATICACIDVEN in the last  168 hours.  No results found for this or any previous visit (from the past 240 hours).       Radiology Studies: MR Lumbar Spine W Wo Contrast Result Date: 01/19/2024 EXAM: MRI LUMBAR SPINE 01/19/2024 02:17:10 PM TECHNIQUE: Multiplanar multisequence MRI of the lumbar spine was performed with and without the administration of intravenous contrast. 5 mL (gadobutrol  (GADAVIST ) 1 MMOL/ML injection 5 mL GADOBUTROL  1 MMOL/ML IV SOLN). COMPARISON: CT lumbar spine 01/19/2024 and lumbar spine radiographs 09/11/2022. CLINICAL HISTORY: Bone mass or bone pain, lumbar spine, aggressive features on xray. FINDINGS: BONES AND ALIGNMENT: Trace retrolisthesis of L1 on L2, similar to prior exam. Bone marrow signal is unremarkable. Chronic superior L3 endplate deformity with approximately 25% height loss centrally. Remaining normal vertebral body heights. There are edematous endplate signal changes and enhancement at the posterior right L3 greater than L4 endplates with an associated small Schmorl's node. Trace edema signal involving the superior L5 endplate on the right. There is multilevel intervertebral disc height and signal loss. SPINAL CORD: The conus medullaris terminates at  the L1 level. No abnormal intradural, conus medullaris, or cauda equina nerve root enhancement. SOFT TISSUES: No paraspinal mass. L1-L2: Disc bulge and mild bilateral facet arthropathy without significant spinal stenosis. There is mild right greater than left bilateral neural foraminal stenosis. L2-L3: There is disc bulge and mild bilateral facet arthropathy with minimal right lateral recess stenosis. There is mild right and minimal left neural foraminal stenosis. L3-L4: There is a right lateral recess-to-foraminal zone disc extrusion approximating the descending right L4 nerve root and contacting the exiting right L3 nerve root, contributing to moderate right neural foraminal stenosis. Mild bilateral facet arthropathy. No significant spinal canal  stenosis. Mild left neuroforaminal narrowing. L4-L5: There is disc bulge and moderate bilateral facet arthropathy without significant spinal stenosis. Mild bilateral neural foraminal narrowing. L5-S1: There is left eccentric disc bulge without significant spinal stenosis. Mild bilateral facet arthropathy. There is no significant right and mild-to-moderate left neural foraminal stenosis. IMPRESSION: 1. L3-L4: Right disc extrusion contacts the exiting right L3 nerve root and contributes to moderate right neural foraminal stenosis. Disc material approximates the descending right L4 nerve root. Associated edematous changes involve the adjacent L3-L4 endplates 2. No high-grade spinal stenosis. 3. No abnormal intradural, conus medullaris, or cauda equina nerve root enhancement. Electronically signed by: Prentice Spade MD 01/19/2024 04:01 PM EST RP Workstation: [id number]   MR BRAIN WO CONTRAST Result Date: 01/19/2024 CLINICAL DATA:  Right leg weakness EXAM: MRI HEAD WITHOUT CONTRAST TECHNIQUE: Multiplanar, multiecho pulse sequences of the brain and surrounding structures were obtained without intravenous contrast. COMPARISON:  None Available. FINDINGS: MRI brain: There is an old infarct in the posterior right frontal lobe with encephalomalacia. No acute infarct. The ventricles are normal. No mass lesion. There are normal flow signals in the carotid arteries and basilar artery. No significant bone marrow signal abnormality. No significant abnormality in the paranasal sinuses or soft tissues. IMPRESSION: Old right posterior frontal infarct with encephalomalacia. No acute infarct or other significant abnormality Electronically Signed   By: Nancyann Burns M.D.   On: 01/19/2024 11:14   CT Hip Right Wo Contrast Result Date: 01/19/2024 CLINICAL DATA:  Right hip pain three days.  Difficulty ambulating. EXAM: CT OF THE RIGHT HIP WITHOUT CONTRAST TECHNIQUE: Multidetector CT imaging of the right hip was performed according to the  standard protocol. Multiplanar CT image reconstructions were also generated. RADIATION DOSE REDUCTION: This exam was performed according to the departmental dose-optimization program which includes automated exposure control, adjustment of the mA and/or kV according to patient size and/or use of iterative reconstruction technique. COMPARISON:  None Available. FINDINGS: Bones/Joint/Cartilage Mild osteoarthritic change of the right hip. No evidence of acute fracture or dislocation. No focal lytic or sclerotic lesion. Ligaments Suboptimally assessed by CT. Muscles and Tendons Unremarkable. Soft tissues No evidence right hip joint effusion. No soft tissue mass. Partially visualized radiation seed implants over the prostate gland. IMPRESSION: 1. No acute findings. 2. Mild osteoarthritic change of the right hip. Electronically Signed   By: Toribio Agreste M.D.   On: 01/19/2024 10:30   CT Lumbar Spine Wo Contrast Result Date: 01/19/2024 CLINICAL DATA:  Right hip pain radiating 2 right foot with weakness. Difficulty ambulating. Symptoms 3 days. EXAM: CT LUMBAR SPINE WITHOUT CONTRAST TECHNIQUE: Multidetector CT imaging of the lumbar spine was performed without intravenous contrast administration. Multiplanar CT image reconstructions were also generated. RADIATION DOSE REDUCTION: This exam was performed according to the departmental dose-optimization program which includes automated exposure control, adjustment of the mA and/or kV according to patient  size and/or use of iterative reconstruction technique. COMPARISON:  Lumbar spine series 09/11/2022 and report only of previous CT 09/11/2022. FINDINGS: Segmentation: Hypoplastic twelfth ribs with 5 lumbar type vertebrae. Alignment: Normal. Vertebrae: Mild to moderate spondylosis throughout the lumbar spine. Superior endplate compression deformity of L3 with approximate 25% height loss unchanged per report of previous CT 09/11/2022 and not significantly changed from previous  plain films 2024. No new/acute compression fracture. Paraspinal and other soft tissues: Negative. Disc levels: L1-2 and L2-3 levels are unremarkable without significant disc disease. L3-4 level demonstrates mild broad-based disc bulge. Vacuum disc phenomenon. Mild right-sided neural foraminal narrowing. Borderline canal stenosis which is multifactorial. L4-5 level demonstrates mild broad-based disc bulge. Vacuum disc phenomenon. Minimal bilateral neural foraminal narrowing and borderline canal stenosis. L5-S1 level demonstrates disc space narrowing without significant disc bulge. No spinal canal stenosis. Moderate left-sided neural foraminal narrowing. Degenerative changes of the sacroiliac joints. Calcified plaque over the abdominal aorta which is normal in caliber. IMPRESSION: 1. No acute findings. 2. Mild to moderate spondylosis throughout the lumbar spine with multilevel disc disease as described from the L3-4 level to the L5-S1 level. Borderline canal stenosis at the L3-4 and L4-5 levels. Moderate left-sided neural foraminal narrowing at the L5-S1 level. 3. Chronic superior endplate compression deformity of L3 with approximate 25% height loss unchanged per report of previous CT 09/11/2022 and not significantly changed from previous plain films 2024. Electronically Signed   By: Toribio Agreste M.D.   On: 01/19/2024 10:24           LOS: 0 days   Time spent= 35 mins    Deliliah Room, MD [facility]  If 7PM-7AM, please contact night-coverage  01/20/2024, 10:25 AM  "

## 2024-01-20 NOTE — [place] FL2 (Deleted)
 " [place]  MEDICAID [id number] LEVEL OF CARE FORM     IDENTIFICATION  Patient Name: Brett Leonard Birthdate: 02-11-1931 Sex: male Admission Date (Current Location): 01/19/2024  [facility] [place] Number:      Facility and Address:         Provider Number:    Attending Physician Name and Address:  Dino Antu, MD  Relative Name and Phone Number:       Current Level of Care:   Recommended Level of Care:   Prior Approval Number:    Date Approved/Denied:   PASRR Number:    Discharge Plan:      Current Diagnoses: Patient Active Problem List   Diagnosis Date Noted   Weakness of lower extremity 01/19/2024   DYSLIPIDEMIA 11/26/2009   Coronary atherosclerosis 11/26/2009   Cerebral artery occlusion with cerebral infarction (HCC) 11/26/2009   Pleural effusion 11/26/2009    Orientation RESPIRATION BLADDER Height & Weight            Weight: 123 lb 10.9 oz (56.1 kg) Height:  5' 7 (170.2 cm)  BEHAVIORAL SYMPTOMS/MOOD NEUROLOGICAL BOWEL NUTRITION STATUS           AMBULATORY STATUS COMMUNICATION OF NEEDS Skin                               Personal Care Assistance Level of Assistance              Functional Limitations Info             SPECIAL CARE FACTORS FREQUENCY                       Contractures      Additional Factors Info                  Current Medications (01/20/2024):  This is the current hospital active medication list Current Facility-Administered Medications  Medication Dose Route Frequency Provider Last Rate Last Admin   acetaminophen  (TYLENOL ) tablet 650 mg  650 mg Oral Q6H PRN Sigdel, Santosh, MD   650 mg at 01/19/24 1455   Or   acetaminophen  (TYLENOL ) suppository 650 mg  650 mg Rectal Q6H PRN Sigdel, Santosh, MD       acetaminophen  (TYLENOL ) tablet 650 mg  650 mg Oral Q8H Sigdel, Santosh, MD   650 mg at 01/20/24 0537   aspirin  EC tablet 81 mg  81 mg Oral Daily Sigdel, Santosh, MD   81 mg at 01/20/24 9078    dexamethasone  (DECADRON ) injection 4 mg  4 mg Intravenous Q8H Sigdel, Santosh, MD   4 mg at 01/20/24 0538   docusate sodium  (COLACE) capsule 100 mg  100 mg Oral BID Sigdel, Santosh, MD   100 mg at 01/20/24 9078   enoxaparin  (LOVENOX ) injection 40 mg  40 mg Subcutaneous Q24H Sigdel, Santosh, MD       gabapentin  (NEURONTIN ) capsule 100 mg  100 mg Oral TID Sigdel, Santosh, MD   100 mg at 01/20/24 9078   levothyroxine  (SYNTHROID ) tablet 25 mcg  25 mcg Oral Daily Sigdel, Santosh, MD   25 mcg at 01/20/24 0537   oxyCODONE  (Oxy IR/ROXICODONE ) immediate release tablet 5 mg  5 mg Oral Q6H PRN Sigdel, Santosh, MD   5 mg at 01/19/24 2316   potassium chloride  (KLOR-CON ) packet 20 mEq  20 mEq Oral Weekly Sigdel, Santosh, MD   20 mEq at 01/20/24 9078   rosuvastatin  (  CRESTOR ) tablet 20 mg  20 mg Oral Daily Sigdel, Santosh, MD   20 mg at 01/20/24 0919   senna-docusate (Senokot-S) tablet 1 tablet  1 tablet Oral QHS PRN Sigdel, Santosh, MD       [START ON 01/21/2024] tamsulosin  (FLOMAX ) capsule 0.4 mg  0.4 mg Oral BID Madueme, Elvira C, RPH         Discharge Medications: Please see discharge summary for a list of discharge medications.  Relevant Imaging Results:  Relevant Lab Results:   Additional Information    Lucie Lunger, LCSWA     "

## 2024-01-20 NOTE — Evaluation (Signed)
 Occupational Therapy Evaluation Patient Details Name: Brett Leonard MRN: [id number] DOB: 1931/01/26 Today's Date: 01/20/2024   History of Present Illness   Brett Leonard is a 89 y.o. male with medical history significant of CAD, prostate cancer and stroke with residual left-sided numbness in the upper extremity comes in with chief complaint of hip pain as well as difficulty with ambulation.  Patient started having right groin pain and heaviness of right leg 3 days ago and has not been able to ambulate since then.  Normally is up and ambulatory, occasionally uses walker and cane.  He says his right leg is heavy.  Denies any back pain issues but does report of pain around the right hip that travels down.  Denies any trauma or fall.  Denies any heavy lifting or any inciting events.  Denies urinary incontinence, urinary retention or bowel incontinence. (per MD)     Clinical Impressions Pt agreeable to OT evaluation. Pt has PRN assist from family at baseline and is usually mod I at baseline for ADL's with AE as needed. Pt required mod A for EOB to chair transfer with RW today. Pt able to ambulate a very short distance in the room with RW with mostly min A. Pt demonstrates need for max A for most lower body ADL's today due to difficult reaching B LE. Pt left in the chair with call bell within reach and chair alarm set. Pt will benefit from continued OT in the hospital to increase strength, balance, and endurance for safe ADL's.        If plan is discharge home, recommend the following:   A little help with walking and/or transfers;A lot of help with bathing/dressing/bathroom;Assistance with cooking/housework;Assist for transportation;Help with stairs or ramp for entrance     Functional Status Assessment   Patient has had a recent decline in their functional status and demonstrates the ability to make significant improvements in function in a reasonable and predictable amount of time.      Equipment Recommendations   None recommended by OT             Precautions/Restrictions   Precautions Precautions: Fall Recall of Precautions/Restrictions: Intact Restrictions Weight Bearing Restrictions Per Provider Order: No     Mobility Bed Mobility Overal bed mobility: Needs Assistance Bed Mobility: Supine to Sit, Sit to Supine     Supine to sit: Supervision Sit to supine: Min assist   General bed mobility comments: Labored effort; min A to sit up at bedside.    Transfers Overall transfer level: Needs assistance Equipment used: Rolling walker (2 wheels) Transfers: Sit to/from Stand, Bed to chair/wheelchair/BSC Sit to Stand: Mod assist     Step pivot transfers: Mod assist, Min assist     General transfer comment: Chair to EOB and back with RW; R LE noted to have trouble weight bearing.      Balance Overall balance assessment: Needs assistance Sitting-balance support: Bilateral upper extremity supported, Feet supported Sitting balance-Leahy Scale: Fair Sitting balance - Comments: seated at EOB   Standing balance support: Bilateral upper extremity supported, During functional activity, Reliant on assistive device for balance Standing balance-Leahy Scale: Poor Standing balance comment: poor to fair with RW                           ADL either performed or assessed with clinical judgement   ADL Overall ADL's : Needs assistance/impaired     Grooming: Set up;Sitting  Upper Body Bathing: Set up;Sitting   Lower Body Bathing: Maximal assistance;Sitting/lateral leans   Upper Body Dressing : Set up;Sitting   Lower Body Dressing: Maximal assistance;Sitting/lateral leans Lower Body Dressing Details (indicate cue type and reason): Based on inability to reach B LE today. Toilet Transfer: Moderate assistance;Rolling walker (2 wheels);Stand-pivot;Ambulation Toilet Transfer Details (indicate cue type and reason): EOB to chair and ambulation  forward and backard beside the bed. Toileting- Clothing Manipulation and Hygiene: Maximal assistance;Moderate assistance;Sitting/lateral lean       Functional mobility during ADLs: Minimal assistance;Rolling walker (2 wheels) General ADL Comments: Able to ambulate ~6 feet total forward and backward near the bed from the chair with RW.     Vision Baseline Vision/History: 1 Wears glasses Ability to See in Adequate Light: 0 Adequate Patient Visual Report: No change from baseline Vision Assessment?: No apparent visual deficits     Perception Perception: Not tested       Praxis Praxis: Not tested       Pertinent Vitals/Pain Pain Assessment Pain Assessment: Faces Faces Pain Scale: Hurts little more Pain Location: R LE; R pelvic area Pain Descriptors / Indicators: Discomfort, Guarding Pain Intervention(s): Monitored during session, Repositioned, Limited activity within patient's tolerance     Extremity/Trunk Assessment Upper Extremity Assessment Upper Extremity Assessment: Generalized weakness;LUE deficits/detail LUE Deficits / Details: Weak with fine motor limitation at baseline from previous stroke. LUE Coordination: decreased fine motor;decreased gross motor   Lower Extremity Assessment Lower Extremity Assessment: Defer to PT evaluation   Cervical / Trunk Assessment Cervical / Trunk Assessment: Kyphotic   Communication Communication Communication: Impaired Factors Affecting Communication: Hearing impaired   Cognition Arousal: Alert Behavior During Therapy: WFL for tasks assessed/performed Cognition: No apparent impairments                               Following commands: Intact       Cueing  General Comments   Cueing Techniques: Verbal cues                 Home Living Family/patient expects to be discharged to:: Private residence Living Arrangements: Alone Available Help at Discharge: Family;Available PRN/intermittently Type of Home:  House Home Access: Ramped entrance     Home Layout: One level     Bathroom Shower/Tub: Producer, Television/film/video: Handicapped height Bathroom Accessibility: Yes   Home Equipment: Agricultural Consultant (2 wheels);Shower seat;Grab bars - tub/shower;Cane - quad;Adaptive equipment Adaptive Equipment: Reacher;Long-handled shoe horn        Prior Functioning/Environment Prior Level of Function : Needs assist       Physical Assist : ADLs (physical)   ADLs (physical): IADLs Mobility Comments: Mod I for ambulation with quad cane. Drives on the weekends; takes the bus during the weekend. ADLs Comments: Independent ADL's using AE as needed; IADL's assisted by family.    OT Problem List: Decreased strength;Decreased range of motion;Decreased activity tolerance;Impaired balance (sitting and/or standing);Pain   OT Treatment/Interventions: Self-care/ADL training;Therapeutic exercise;DME and/or AE instruction;Therapeutic activities;Patient/family education;Balance training;Energy conservation      OT Goals(Current goals can be found in the care plan section)   Acute Rehab OT Goals Patient Stated Goal: Improve function. OT Goal Formulation: With patient Time For Goal Achievement: 02/03/24 Potential to Achieve Goals: Good   OT Frequency:  Min 2X/week                  AM-PAC OT 6 Clicks Daily Activity  Outcome Measure Help from another person eating meals?: None Help from another person taking care of personal grooming?: A Little Help from another person toileting, which includes using toliet, bedpan, or urinal?: A Lot Help from another person bathing (including washing, rinsing, drying)?: A Lot Help from another person to put on and taking off regular upper body clothing?: A Little Help from another person to put on and taking off regular lower body clothing?: A Lot 6 Click Score: 16   End of Session Equipment Utilized During Treatment: Rolling walker (2 wheels);Gait  belt  Activity Tolerance: Patient tolerated treatment well Patient left: in chair;with call bell/phone within reach;with chair alarm set  OT Visit Diagnosis: Unsteadiness on feet (R26.81);Other abnormalities of gait and mobility (R26.89);Muscle weakness (generalized) (M62.81)                Time: 8865-8848 OT Time Calculation (min): 17 min Charges:  OT General Charges $OT Visit: 1 Visit OT Evaluation $OT Eval Low Complexity: 1 Low  Shaquel Josephson OT, MOT  Jayson Person 01/20/2024, 3:31 PM

## 2024-01-20 NOTE — Plan of Care (Signed)
" °  Problem: Acute Rehab OT Goals (only OT should resolve) Goal: Pt. Will Perform Grooming Flowsheets (Taken 01/20/2024 1534) Pt Will Perform Grooming:  with modified independence  standing Goal: Pt. Will Perform Lower Body Bathing Flowsheets (Taken 01/20/2024 1534) Pt Will Perform Lower Body Bathing:  with modified independence  with adaptive equipment  sitting/lateral leans Goal: Pt. Will Perform Lower Body Dressing Flowsheets (Taken 01/20/2024 1534) Pt Will Perform Lower Body Dressing:  with modified independence  with adaptive equipment  sitting/lateral leans Goal: Pt. Will Transfer To Toilet Flowsheets (Taken 01/20/2024 1534) Pt Will Transfer to Toilet:  with supervision  stand pivot transfer  ambulating Goal: Pt. Will Perform Toileting-Clothing Manipulation Flowsheets (Taken 01/20/2024 1534) Pt Will Perform Toileting - Clothing Manipulation and hygiene:  with modified independence  sitting/lateral leans Goal: Pt/Caregiver Will Perform Home Exercise Program Flowsheets (Taken 01/20/2024 1534) Pt/caregiver will Perform Home Exercise Program:  Increased strength  Both right and left upper extremity  Independently  Porcha Deblanc OT, MOT  "

## 2024-01-21 DIAGNOSIS — R29898 Other symptoms and signs involving the musculoskeletal system: Secondary | ICD-10-CM | POA: Diagnosis not present

## 2024-01-21 MED ORDER — GABAPENTIN 100 MG PO CAPS: 100.0000 mg | ORAL_CAPSULE | Freq: Two times a day (BID) | ORAL | 0 refills | Status: AC

## 2024-01-21 MED ORDER — OXYCODONE HCL 5 MG PO TABS: 5.0000 mg | ORAL_TABLET | Freq: Four times a day (QID) | ORAL | 0 refills | Status: AC | PRN

## 2024-01-21 MED ORDER — PREDNISONE 20 MG PO TABS: 20.0000 mg | ORAL_TABLET | Freq: Every day | ORAL | 0 refills | Status: AC

## 2024-01-21 NOTE — Progress Notes (Signed)
 Physical Therapy Treatment Patient Details Name: Brett Leonard MRN: [id number] DOB: [birth year] Today's Date: 01/21/2024   History of Present Illness Brett Leonard is a 89 y.o. male with medical history significant of CAD, prostate cancer and stroke with residual left-sided numbness in the upper extremity comes in with chief complaint of hip pain as well as difficulty with ambulation.  Patient started having right groin pain and heaviness of right leg 3 days ago and has not been able to ambulate since then.  Normally is up and ambulatory, occasionally uses walker and cane.  He says his right leg is heavy.  Denies any back pain issues but does report of pain around the right hip that travels down.  Denies any trauma or fall.  Denies any heavy lifting or any inciting events.  Denies urinary incontinence, urinary retention or bowel incontinence.    PT Comments  Patient agrees to PT treatment session. Patient was received seated in chair at start of session. He completed seated LE exercises well with verbal and visual cueing. No reports of  pain throughout. Pt given print off of HEP. Min assist for STS with RW. Pt able to progress ambulation distance this date but is limited due to pain. Pt tolerates sitting in chair at end of session, alarm set, call button in reach and all needs met. MD present at end of session. Patient will benefit from continued skilled physical therapy acutely and in recommended venue in order to address current deficits and improve overall function.     If plan is discharge home, recommend the following: Two people to help with walking and/or transfers;Two people to help with bathing/dressing/bathroom;Assistance with feeding;Assistance with cooking/housework;Direct supervision/assist for medications management;Help with stairs or ramp for entrance;Assist for transportation   Can travel by private vehicle        Equipment Recommendations  None recommended by PT     Recommendations for Other Services       Precautions / Restrictions Precautions Precautions: Fall Recall of Precautions/Restrictions: Intact Restrictions Weight Bearing Restrictions Per Provider Order: No     Mobility  Bed Mobility               General bed mobility comments: pt received sitting in chair at start of session    Transfers Overall transfer level: Needs assistance Equipment used: Rolling walker (2 wheels) Transfers: Sit to/from Stand Sit to Stand: Contact guard assist, Min assist           General transfer comment: STS from chair using RW, min assist to stabilize RW, pt required multiple attmepts to complete stand due to inc pain and general weakness, CGA to pt for safety    Ambulation/Gait Ambulation/Gait assistance: Min assist, Contact guard assist Gait Distance (Feet): 40 Feet Assistive device: Rolling walker (2 wheels) Gait Pattern/deviations: Step-to pattern, Decreased step length - right, Shuffle, Wide base of support, Antalgic, Decreased stride length Gait velocity: Dec     General Gait Details: pt ambulates in room and into halls with RW and close CGA/min assist, pt reports inc pain with weight bearing into RLE, dec weight shift to R, inc input to BUE through [employer]     Tilt Bed    Modified Rankin (Stroke Patients Only)       Balance Overall balance assessment: Needs assistance Sitting-balance support: Bilateral upper extremity supported, Feet supported Sitting balance-Leahy Scale: Fair Sitting balance - Comments:  seated in chair   Standing balance support: Bilateral upper extremity supported, During functional activity, Reliant on assistive device for balance Standing balance-Leahy Scale: Poor Standing balance comment: poor to fair with RW          Communication Communication Communication: Impaired Factors Affecting Communication: Hearing impaired;Other (comment) (very HOH)   Cognition Arousal: Alert Behavior During Therapy: WFL for tasks assessed/performed   PT - Cognitive impairments: No apparent impairments         Following commands: Intact      Cueing Cueing Techniques: Verbal cues, Visual cues  Exercises General Exercises - Lower Extremity Long Arc Quad: AROM, Strengthening, Both, 10 reps, Seated Hip Flexion/Marching: AROM, Strengthening, Both, 10 reps, Seated Toe Raises: AROM, Strengthening, Both, 10 reps, Seated Heel Raises: AROM, Strengthening, Both, 10 reps, Seated    General Comments        Pertinent Vitals/Pain Pain Assessment Pain Assessment: 0-10 Pain Score: 8  Pain Location: none at rest, gets to 8/10 with weight bearing Pain Descriptors / Indicators: Sharp Pain Intervention(s): Limited activity within patient's tolerance, Repositioned, Monitored during session    Home Living                          Prior Function            PT Goals (current goals can now be found in the care plan section) Acute Rehab PT Goals Patient Stated Goal: to decrease the pain and get better before going home PT Goal Formulation: With patient/family Time For Goal Achievement: 02/02/24 Potential to Achieve Goals: Good Progress towards PT goals: Progressing toward goals    Frequency    Min 3X/week      PT Plan      Co-evaluation              AM-PAC PT 6 Clicks Mobility   Outcome Measure  Help needed turning from your back to your side while in a flat bed without using bedrails?: A Little Help needed moving from lying on your back to sitting on the side of a flat bed without using bedrails?: A Little Help needed moving to and from a bed to a chair (including a wheelchair)?: A Little Help needed standing up from a chair using your arms (e.g., wheelchair or bedside chair)?: A Little Help needed to walk in hospital room?: A Little Help needed climbing 3-5 steps with a railing? : A Lot 6 Click Score: 17    End of  Session Equipment Utilized During Treatment: Gait belt Activity Tolerance: Patient limited by pain;Patient tolerated treatment well Patient left: in chair;with call bell/phone within reach;with family/visitor present;with chair alarm set Nurse Communication: Mobility status PT Visit Diagnosis: Unsteadiness on feet (R26.81);Other abnormalities of gait and mobility (R26.89);History of falling (Z91.81);Muscle weakness (generalized) (M62.81);Difficulty in walking, not elsewhere classified (R26.2);Pain Pain - Right/Left: Right Pain - part of body: Leg     Time: 8946-8890 PT Time Calculation (min) (ACUTE ONLY): 16 min  Charges:    $Therapeutic Exercise: 8-22 mins PT General Charges $$ ACUTE PT VISIT: 1 Visit                     1:31 PM, 01/21/2024 Marrio Scribner Powell-Butler, PT, DPT [place] with [facility]

## 2024-01-21 NOTE — Discharge Summary (Signed)
 " Physician Discharge Summary   Patient: Brett Leonard MRN: [id number] DOB: Mar 20, 1931  Admit date:     01/19/2024  Discharge date: 01/21/2024  Discharge Physician: Reyes VEAR Gaw   PCP: [facility]   Recommendations at discharge:    Pt started on short-term oxycodone  for severe pain, gabapentin  BID, and steroid burst for relief of pain.  DC'ed on 4 more days of 40 mg prednisone , then can stop.  DC'ed on 2 weeks worth of gabapentin , can review whether continued need at that point.   Recommend outpatient follow-up with neurosurgery in next 4 - 6 weeks pending improvement Hgb minimally dropped to 12.2 from his baseline of 13.  Recommend rechecking in next week to ensure Hgb remains stable.    Discharge Diagnoses: Principal Problem:   Weakness of lower extremity  Resolved Problems:   * No resolved hospital problems. *  Hospital Course: 89 y.o. male with medical history significant of CAD, prostate cancer and stroke with residual left-sided numbness in the upper extremity comes in with chief complaint of hip pain as well as difficulty with ambulation.  Patient started having right groin pain and heaviness of right leg 3 days prior to admission.  He was living at home by himself.  In ED, MRI lumbar spine showed L3-L4 right disc extrusion contacts the exiting right L3 nerve root and contributes to moderate right neural foraminal stenosis.  No spinal stenosis or other reasons for surgical intervention.  Started on gabapentin , analgesics, and steroid burst.  PT recommended rehab at [place].  Worked well with PT while in house.  Pain improved greatly on combination therapy of opioids as needed, gabapentin , and decadron  (switched to prednisone  at [facility]).  Due to his improvement, he was discharged to SNF for continued rehab on 01/21/2024.      Assessment & Plan:  Principal Problem:   Weakness of lower extremity   Right lower extremity weakness/radicular/neuropathy symptoms in the setting  of L3-L4 degenerative disc disease leading to moderate right neural foraminal stenosis, POA: No high-grade spinal stenosis, No abnormal intradural, conus medullaris, or cauda equina nerve root enhancement. -No urgent neurosurgical intervention needed. -PT on board, recommended short-term rehabilitation. - Continue with physical therapy - Continue gabapentin  100 mg p.o. thrice daily - Outpatient follow-up with neurosurgery   H/o Stroke, POA: Continue with aspirin  and statin.   History of CAD status post CABG, POA: Continue with aspirin  and statin.  No acute issues.   Hypothyroidism, POA: Continue Synthroid .   Hyperlipidemia, POA: Continue with statin   BPH, POA: Continue with tamsulosin         Disposition: SNF Diet recommendation:  Regular diet DISCHARGE MEDICATION: Allergies as of 01/21/2024       Reactions   Propoxyphene N-acetaminophen     REACTION: hallucinations        Medication List     TAKE these medications    aspirin  EC 81 MG tablet Take 81 mg by mouth daily. Swallow whole.   docusate sodium  100 MG capsule Commonly known as: COLACE Take 100 mg by mouth 2 (two) times daily.   furosemide  20 MG tablet Commonly known as: LASIX  Take 20 mg by mouth once a week.   gabapentin  100 MG capsule Commonly known as: NEURONTIN  Take 1 capsule (100 mg total) by mouth 2 (two) times daily.   levothyroxine  25 MCG tablet Commonly known as: SYNTHROID  Take 25 mcg by mouth daily.   oxyCODONE  5 MG immediate release tablet Commonly known as: Oxy IR/ROXICODONE  Take 1 tablet (  5 mg total) by mouth every 6 (six) hours as needed for severe pain (pain score 7-10).   potassium chloride  SA 20 MEQ tablet Commonly known as: KLOR-CON  M Take 20 mEq by mouth once a week.   predniSONE  20 MG tablet Commonly known as: DELTASONE  Take 1 tablet (20 mg total) by mouth daily with breakfast.   rosuvastatin  20 MG tablet Commonly known as: CRESTOR  take 1 tablet once daily.   tamsulosin   0.4 MG Caps capsule Commonly known as: FLOMAX  Take 0.4 mg by mouth in the morning and at bedtime.        Contact information for after-discharge care     Destination     [facility] .   Service: Skilled Nursing Contact information: [address] [place]   [address] [phone]                    Discharge Exam: Filed Weights   01/19/24 0924  Weight: 56.1 kg   General exam: Appears calm and comfortable.  Examined while walking with PT using a walker and while seated in chair.    Respiratory system: Clear to auscultation. Respiratory effort normal. Cardiovascular system: S1 & S2 heard, RRR. No JVD, murmurs, rubs, gallops or clicks. No pedal edema. Gastrointestinal system: Abdomen is nondistended, soft and nontender. No organomegaly or masses felt. Normal bowel sounds heard. Central nervous system: Alert and oriented. No focal neurological deficits. Extremities: Slightly reduced strength of the right lower extremity, sensation intact.  No clonus or upgoing Babinski. Skin: No rashes, lesions or ulcers Psychiatry: Judgement and insight appear normal. Mood & affect appropriate.  Condition at discharge: good  The results of significant diagnostics from this hospitalization (including imaging, microbiology, ancillary and laboratory) are listed below for reference.   Imaging Studies: MR Lumbar Spine W Wo Contrast Result Date: 01/19/2024 EXAM: MRI LUMBAR SPINE 01/19/2024 02:17:10 PM TECHNIQUE: Multiplanar multisequence MRI of the lumbar spine was performed with and without the administration of intravenous contrast. 5 mL (gadobutrol  (GADAVIST ) 1 MMOL/ML injection 5 mL GADOBUTROL  1 MMOL/ML IV SOLN). COMPARISON: CT lumbar spine 01/19/2024 and lumbar spine radiographs 09/11/2022. CLINICAL HISTORY: Bone mass or bone pain, lumbar spine, aggressive features on xray. FINDINGS: BONES AND ALIGNMENT: Trace retrolisthesis of L1 on L2, similar to prior exam. Bone  marrow signal is unremarkable. Chronic superior L3 endplate deformity with approximately 25% height loss centrally. Remaining normal vertebral body heights. There are edematous endplate signal changes and enhancement at the posterior right L3 greater than L4 endplates with an associated small Schmorl's node. Trace edema signal involving the superior L5 endplate on the right. There is multilevel intervertebral disc height and signal loss. SPINAL CORD: The conus medullaris terminates at the L1 level. No abnormal intradural, conus medullaris, or cauda equina nerve root enhancement. SOFT TISSUES: No paraspinal mass. L1-L2: Disc bulge and mild bilateral facet arthropathy without significant spinal stenosis. There is mild right greater than left bilateral neural foraminal stenosis. L2-L3: There is disc bulge and mild bilateral facet arthropathy with minimal right lateral recess stenosis. There is mild right and minimal left neural foraminal stenosis. L3-L4: There is a right lateral recess-to-foraminal zone disc extrusion approximating the descending right L4 nerve root and contacting the exiting right L3 nerve root, contributing to moderate right neural foraminal stenosis. Mild bilateral facet arthropathy. No significant spinal canal stenosis. Mild left neuroforaminal narrowing. L4-L5: There is disc bulge and moderate bilateral facet arthropathy without significant spinal stenosis. Mild bilateral neural foraminal narrowing. L5-S1: There is  left eccentric disc bulge without significant spinal stenosis. Mild bilateral facet arthropathy. There is no significant right and mild-to-moderate left neural foraminal stenosis. IMPRESSION: 1. L3-L4: Right disc extrusion contacts the exiting right L3 nerve root and contributes to moderate right neural foraminal stenosis. Disc material approximates the descending right L4 nerve root. Associated edematous changes involve the adjacent L3-L4 endplates 2. No high-grade spinal stenosis. 3.  No abnormal intradural, conus medullaris, or cauda equina nerve root enhancement. Electronically signed by: Prentice Spade MD 01/19/2024 04:01 PM EST RP Workstation: [id number]   MR BRAIN WO CONTRAST Result Date: 01/19/2024 CLINICAL DATA:  Right leg weakness EXAM: MRI HEAD WITHOUT CONTRAST TECHNIQUE: Multiplanar, multiecho pulse sequences of the brain and surrounding structures were obtained without intravenous contrast. COMPARISON:  None Available. FINDINGS: MRI brain: There is an old infarct in the posterior right frontal lobe with encephalomalacia. No acute infarct. The ventricles are normal. No mass lesion. There are normal flow signals in the carotid arteries and basilar artery. No significant bone marrow signal abnormality. No significant abnormality in the paranasal sinuses or soft tissues. IMPRESSION: Old right posterior frontal infarct with encephalomalacia. No acute infarct or other significant abnormality Electronically Signed   By: Nancyann Burns M.D.   On: 01/19/2024 11:14   CT Hip Right Wo Contrast Result Date: 01/19/2024 CLINICAL DATA:  Right hip pain three days.  Difficulty ambulating. EXAM: CT OF THE RIGHT HIP WITHOUT CONTRAST TECHNIQUE: Multidetector CT imaging of the right hip was performed according to the standard protocol. Multiplanar CT image reconstructions were also generated. RADIATION DOSE REDUCTION: This exam was performed according to the departmental dose-optimization program which includes automated exposure control, adjustment of the mA and/or kV according to patient size and/or use of iterative reconstruction technique. COMPARISON:  None Available. FINDINGS: Bones/Joint/Cartilage Mild osteoarthritic change of the right hip. No evidence of acute fracture or dislocation. No focal lytic or sclerotic lesion. Ligaments Suboptimally assessed by CT. Muscles and Tendons Unremarkable. Soft tissues No evidence right hip joint effusion. No soft tissue mass. Partially visualized radiation seed  implants over the prostate gland. IMPRESSION: 1. No acute findings. 2. Mild osteoarthritic change of the right hip. Electronically Signed   By: Toribio Agreste M.D.   On: 01/19/2024 10:30   CT Lumbar Spine Wo Contrast Result Date: 01/19/2024 CLINICAL DATA:  Right hip pain radiating 2 right foot with weakness. Difficulty ambulating. Symptoms 3 days. EXAM: CT LUMBAR SPINE WITHOUT CONTRAST TECHNIQUE: Multidetector CT imaging of the lumbar spine was performed without intravenous contrast administration. Multiplanar CT image reconstructions were also generated. RADIATION DOSE REDUCTION: This exam was performed according to the departmental dose-optimization program which includes automated exposure control, adjustment of the mA and/or kV according to patient size and/or use of iterative reconstruction technique. COMPARISON:  Lumbar spine series 09/11/2022 and report only of previous CT 09/11/2022. FINDINGS: Segmentation: Hypoplastic twelfth ribs with 5 lumbar type vertebrae. Alignment: Normal. Vertebrae: Mild to moderate spondylosis throughout the lumbar spine. Superior endplate compression deformity of L3 with approximate 25% height loss unchanged per report of previous CT 09/11/2022 and not significantly changed from previous plain films 2024. No new/acute compression fracture. Paraspinal and other soft tissues: Negative. Disc levels: L1-2 and L2-3 levels are unremarkable without significant disc disease. L3-4 level demonstrates mild broad-based disc bulge. Vacuum disc phenomenon. Mild right-sided neural foraminal narrowing. Borderline canal stenosis which is multifactorial. L4-5 level demonstrates mild broad-based disc bulge. Vacuum disc phenomenon. Minimal bilateral neural foraminal narrowing and borderline canal stenosis. L5-S1 level demonstrates disc space  narrowing without significant disc bulge. No spinal canal stenosis. Moderate left-sided neural foraminal narrowing. Degenerative changes of the sacroiliac  joints. Calcified plaque over the abdominal aorta which is normal in caliber. IMPRESSION: 1. No acute findings. 2. Mild to moderate spondylosis throughout the lumbar spine with multilevel disc disease as described from the L3-4 level to the L5-S1 level. Borderline canal stenosis at the L3-4 and L4-5 levels. Moderate left-sided neural foraminal narrowing at the L5-S1 level. 3. Chronic superior endplate compression deformity of L3 with approximate 25% height loss unchanged per report of previous CT 09/11/2022 and not significantly changed from previous plain films 2024. Electronically Signed   By: Toribio Agreste M.D.   On: 01/19/2024 10:24    Microbiology: Results for orders placed or performed during the hospital encounter of 10/22/09  MRSA PCR Screening     Status: None   Collection Time: 10/24/09  5:20 PM  Result Value Ref Range Status   MRSA by PCR  NEGATIVE Final    NEGATIVE        The GeneXpert MRSA Assay (FDA approved for NASAL specimens only), is one component of a comprehensive MRSA colonization surveillance program. It is not intended to diagnose MRSA infection nor to guide or monitor treatment for MRSA infections.    Labs: CBC: Recent Labs  Lab 01/19/24 1106  WBC 8.4  NEUTROABS 5.2  HGB 12.2*  HCT 37.6*  MCV 93.1  PLT 155   Basic Metabolic Panel: Recent Labs  Lab 01/19/24 1106  NA 141  K 3.6  CL 106  CO2 28  GLUCOSE 142*  BUN 21  CREATININE 0.97  CALCIUM  8.6*   Liver Function Tests: No results for input(s): AST, ALT, ALKPHOS, BILITOT, PROT, ALBUMIN in the last 168 hours. CBG: No results for input(s): GLUCAP in the last 168 hours.  Discharge time spent: less than 30 minutes.  Signed: Reyes VEAR Gaw, MD [facility] 01/21/2024 "

## 2024-01-21 NOTE — Plan of Care (Signed)
  Problem: Education: Goal: Knowledge of General Education information will improve Description: Including pain rating scale, medication(s)/side effects and non-pharmacologic comfort measures Outcome: Progressing   Problem: Activity: Goal: Risk for activity intolerance will decrease Outcome: Progressing   Problem: Pain Managment: Goal: General experience of comfort will improve and/or be controlled Outcome: Progressing

## 2024-01-21 NOTE — Plan of Care (Signed)

## 2024-01-21 NOTE — Progress Notes (Signed)
 Mobility Specialist Progress Note:    01/21/24 1345  Mobility  Activity Ambulated with assistance  Level of Assistance Minimal assist, patient does 75% or more  Assistive Device Front wheel walker  Distance Ambulated (ft) 10 ft  Range of Motion/Exercises Active;All extremities  Activity Response Tolerated well  Mobility Referral Yes  Mobility visit 1 Mobility  Mobility Specialist Start Time (ACUTE ONLY) 1345  Mobility Specialist Stop Time (ACUTE ONLY) 1403  Mobility Specialist Time Calculation (min) (ACUTE ONLY) 18 min   Pt received in bathroom, requesting assistance to chair. Required CGA to stand and ambulate with RW. Tolerated well, c/o right hip pain. Left in chair, alarm on. Daughter in room, all needs met.  Brett Leonard Mobility Specialist Please contact via Special Educational Needs Teacher or  Rehab office at [phone]

## 2024-01-21 NOTE — Progress Notes (Signed)
 Attempted to call report to [facility] x 5.

## 2024-01-21 NOTE — TOC Transition Note (Signed)
 Transition of Care [facility]) - Discharge Note   Patient Details  Name: Brett Leonard MRN: [id number] Date of Birth: 1931/03/24  Transition of Care [facility] CM/SW Contact:  Noreen KATHEE Cleotilde ISRAEL Phone Number: 01/21/2024, 3:36 PM   Clinical Narrative:     CSW spoke with patient at bedside about his auth being approved this morning. CSW informed patient that he would DC today to [facility]. Patient asked if his daughter was aware. CSW spoke with daughter @ 3:35 after room and report was provided by [facility] and given to nurse. CSW signing off.   Final next level of care: Skilled Nursing Facility Barriers to Discharge: Barriers Resolved   Patient Goals and CMS Choice Patient states their goals for this hospitalization and ongoing recovery are:: Get stronger CMS Medicare.gov Compare Post Acute Care list provided to:: Patient Choice offered to / list presented to : Adult Children, Patient      Discharge Placement              Patient chooses bed at: [facility] Patient to be transferred to facility by: Zel[facility] Staff Name of family member notified: [name] and Majd Patient and family notified of of transfer: 01/21/24  Discharge Plan and Services Additional resources added to the After Visit Summary for   In-house Referral: Clinical Social Work Discharge Planning Services: CM Consult Post Acute Care Choice: Skilled Nursing Facility                               Social Drivers of Health (SDOH) Interventions SDOH Screenings   Food Insecurity: No Food Insecurity (01/19/2024)  Housing: Low Risk (01/19/2024)  Transportation Needs: No Transportation Needs (01/19/2024)  Utilities: Not At Risk (01/19/2024)  Social Connections: Socially Integrated (01/19/2024)  Tobacco Use: Low Risk (01/20/2024)     Readmission Risk Interventions     No data to display

## 2024-01-24 ENCOUNTER — Non-Acute Institutional Stay (SKILLED_NURSING_FACILITY): Payer: Self-pay | Admitting: Adult Health

## 2024-01-24 ENCOUNTER — Encounter: Payer: Self-pay | Admitting: Adult Health

## 2024-01-24 DIAGNOSIS — N138 Other obstructive and reflux uropathy: Secondary | ICD-10-CM | POA: Insufficient documentation

## 2024-01-24 DIAGNOSIS — K5909 Other constipation: Secondary | ICD-10-CM | POA: Insufficient documentation

## 2024-01-24 DIAGNOSIS — R600 Localized edema: Secondary | ICD-10-CM | POA: Diagnosis not present

## 2024-01-24 DIAGNOSIS — N401 Enlarged prostate with lower urinary tract symptoms: Secondary | ICD-10-CM | POA: Diagnosis not present

## 2024-01-24 DIAGNOSIS — E039 Hypothyroidism, unspecified: Secondary | ICD-10-CM | POA: Diagnosis not present

## 2024-01-24 DIAGNOSIS — E785 Hyperlipidemia, unspecified: Secondary | ICD-10-CM | POA: Diagnosis not present

## 2024-01-24 DIAGNOSIS — M5116 Intervertebral disc disorders with radiculopathy, lumbar region: Secondary | ICD-10-CM | POA: Insufficient documentation

## 2024-01-24 DIAGNOSIS — I2510 Atherosclerotic heart disease of native coronary artery without angina pectoris: Secondary | ICD-10-CM | POA: Diagnosis not present

## 2024-01-24 DIAGNOSIS — D649 Anemia, unspecified: Secondary | ICD-10-CM | POA: Diagnosis not present

## 2024-01-24 DIAGNOSIS — Z8673 Personal history of transient ischemic attack (TIA), and cerebral infarction without residual deficits: Secondary | ICD-10-CM | POA: Insufficient documentation

## 2024-01-24 NOTE — Progress Notes (Signed)
 " Location:  [facility] Room Number: 143 Place of Service:  SNF ([id number])   CODE STATUS: dnr  Allergies[1]  Chief Complaint  Patient presents with   Hospitalization Follow-up    HPI:  He is a 89 year old man who has been hospitalized from 01-19-24 through 01-21-24. 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 He presented to the ED with right hip pain with right groin and heaviness in right leg for 3 days prior to admission.  In the ED the lumbar MRI demonstrated L3-L4 right disc extrusion contacts the exiting right L3 nerve root and contributes to moderate right neural foraminal stenosis. He was started on gabapentin ; steroid burst and pain medication. His therapy evaluation recommended short term rehab.  He is here for short term rehab with his goal to return back home. He will continue to be followed for his chronic illnesses including:  History of cva (cerebrovascular accident)  Bph with urinary obstruction:  Hyperlipidemia: LDL goal <100  Past Medical History:  Diagnosis Date   Body mass index (BMI) of 20.0-20.9 in adult    Cancer [facility])    Prostate   Hypertension    Palpitations    Renal insufficiency     Past Surgical History:  Procedure Laterality Date   CORONARY ARTERY BYPASS GRAFT  10/24/2009    Social History   Socioeconomic History   Marital status: Married    Spouse name: Not on file   Number of children: 3   Years of education: Not on file   Highest education level: Not on file  Occupational History   Not on file  Tobacco Use   Smoking status: Never   Smokeless tobacco: Never  Vaping Use   Vaping status: Never Used  Substance and Sexual Activity   Alcohol  use: Never   Drug use: Never   Sexual activity: Not on file  Other Topics Concern   Not on file  Social History Narrative   Not on file   Social Drivers of Health   Tobacco Use: Low Risk (01/24/2024)   Patient History     Smoking Tobacco Use: Never    Smokeless Tobacco Use: Never    Passive Exposure: Not on file  Financial Resource Strain: Not on file  Food Insecurity: No Food Insecurity (01/19/2024)   Epic    Worried About Programme Researcher, Broadcasting/film/video in the Last Year: Never true    Ran Out of Food in the Last Year: Never true  Transportation Needs: No Transportation Needs (01/19/2024)   Epic    Lack of Transportation (Medical): No    Lack of Transportation (Non-Medical): No  Physical Activity: Not on file  Stress: Not on file  Social Connections: Socially Integrated (01/19/2024)   Social Connection and Isolation Panel    Frequency of Communication with Friends and Family: More than three times a week    Frequency of Social Gatherings with Friends and Family: More than three times a week    Attends Religious Services: More than 4 times per year    Active Member of [employer] or Organizations: Yes    Attends Banker Meetings: More than 4 times per year    Marital Status: Married  Catering Manager Violence: Not At Risk (01/19/2024)   Epic    Fear of Current or Ex-Partner: No    Emotionally Abused: No    Physically Abused: No    Sexually Abused: No  Depression (PHQ2-9): Not on file  Alcohol  Screen: Not on file  Housing: Low Risk (01/19/2024)   Epic    Unable to Pay for Housing in the Last Year: No    Number of Times Moved in the Last Year: 0    Homeless in the Last Year: No  Utilities: Not At Risk (01/19/2024)   Epic    Threatened with loss of utilities: No  Health Literacy: Not on file   Family History  Problem Relation Age of Onset   Diabetes Mother    Glaucoma Mother    Cancer Father       VITAL SIGNS BP 126/73   Pulse (!) 56   Temp (!) 97.2 F (36.2 C)   Resp 20   Ht 5' 7 (1.702 m)   Wt 119 lb 3.2 oz (54.1 kg)   SpO2 98%   BMI 18.67 kg/m   Outpatient Encounter Medications as of 01/24/2024  Medication Sig   aspirin  EC 81 MG tablet Take 81 mg by mouth daily. Swallow whole.    docusate sodium  (COLACE) 100 MG capsule Take 100 mg by mouth 2 (two) times daily.   furosemide  (LASIX ) 20 MG tablet Take 20 mg by mouth once a week.   gabapentin  (NEURONTIN ) 100 MG capsule Take 1 capsule (100 mg total) by mouth 2 (two) times daily.   levothyroxine  (SYNTHROID ) 25 MCG tablet Take 25 mcg by mouth daily.   oxyCODONE  (OXY IR/ROXICODONE ) 5 MG immediate release tablet Take 1 tablet (5 mg total) by mouth every 6 (six) hours as needed for severe pain (pain score 7-10).   potassium chloride  SA (KLOR-CON  M) 20 MEQ tablet Take 20 mEq by mouth once a week.   predniSONE  (DELTASONE ) 20 MG tablet Take 1 tablet (20 mg total) by mouth daily with breakfast.   rosuvastatin  (CRESTOR ) 20 MG tablet take 1 tablet once daily.   tamsulosin  (FLOMAX ) 0.4 MG CAPS capsule Take 0.4 mg by mouth in the morning and at bedtime.   No facility-administered encounter medications on file as of 01/24/2024.     SIGNIFICANT DIAGNOSTIC EXAMS  LABS  01-19-24: wbc 8.4; hgb 12.2; hct 37.6; mcv 93.1 plt 155 ;glucose 142; bun 21; creat 0.97; k+ 3.6; na++ 141; ca 8.6 gfr >60   Review of Systems  Constitutional:  Negative for malaise/fatigue.  Respiratory:  Negative for cough and shortness of breath.   Cardiovascular:  Negative for chest pain, palpitations and leg swelling.  Gastrointestinal:  Negative for abdominal pain, constipation and heartburn.  Musculoskeletal:        Right hip pain: right leg feels heavy   Skin: Negative.   Neurological:  Positive for focal weakness. Negative for dizziness.       Right side   Psychiatric/Behavioral:  The patient is not nervous/anxious.    Physical Exam Constitutional:      General: He is not in acute distress.    Appearance: He is underweight. He is not diaphoretic.  Neck:     Thyroid : No thyromegaly.  Cardiovascular:     Rate and Rhythm: Normal rate and regular rhythm.     Pulses: Normal pulses.     Heart sounds: Normal heart sounds.  Pulmonary:     Effort:  Pulmonary effort is normal. No respiratory distress.     Breath sounds: Normal breath sounds.  Abdominal:     General: Bowel sounds are normal. There is no distension.     Palpations: Abdomen is soft.     Tenderness: There is  no abdominal tenderness.  Musculoskeletal:     Cervical back: Neck supple.     Right lower leg: No edema.     Left lower leg: No edema.     Comments: Right side weakness: right lower extremity weak against gravity Right hand grip weak  Lymphadenopathy:     Cervical: No cervical adenopathy.  Skin:    General: Skin is warm and dry.  Neurological:     Mental Status: He is alert and oriented to person, place, and time.  Psychiatric:        Mood and Affect: Mood normal.      ASSESSMENT/ PLAN:  TODAY  Lumbar disc disease with radiculopathy: has right hip pain and right leg heaviness. Will continue therapy as directed to improve upon his level of independence with his adls. Will continue gabapentin  100 mg twice daily through 02-03-24; prednisone  20 mg daily through 01-25-24; and has oxycodone  5 mg every 6 hours as needed through 01-25-24. Will begin tylenol  cr 650 mg every 6 hours routinely.   2. History of cva (cerebrovascular accident) is on asa 81 mg daily and is on statin daily   3. Bph with urinary obstruction: will continue flomax  0.4 mg twice daily   4. Hyperlipidemia: LDL goal <100: will continue crestor  20 mg daily   5. Bilateral lower extremity edema: will continue lasix  20 mg with k+20 meq weekly   6. Atherosclerosis of native coronary artery of native heart without angina pectoris: no reports of chest pain. Is on asa and statin  7. Chronic constipation: will continue senna s twice daily  8. Acquired hypothyroidism: will continue synthroid  25 mcg daily   9. Mild anemia: hgb 12.2 will monitor   Will check hgb/hct and bmp   Barnie Seip NP [facility]   call [phone]      [1]  Allergies Allergen Reactions   Propoxyphene  N-Acetaminophen      REACTION: hallucinations   "

## 2024-01-25 ENCOUNTER — Non-Acute Institutional Stay (SKILLED_NURSING_FACILITY): Payer: Self-pay | Admitting: Internal Medicine

## 2024-01-25 ENCOUNTER — Encounter: Payer: Self-pay | Admitting: Internal Medicine

## 2024-01-25 DIAGNOSIS — R29898 Other symptoms and signs involving the musculoskeletal system: Secondary | ICD-10-CM | POA: Diagnosis not present

## 2024-01-25 DIAGNOSIS — D649 Anemia, unspecified: Secondary | ICD-10-CM | POA: Diagnosis not present

## 2024-01-25 DIAGNOSIS — M5116 Intervertebral disc disorders with radiculopathy, lumbar region: Secondary | ICD-10-CM | POA: Diagnosis not present

## 2024-01-25 NOTE — Assessment & Plan Note (Signed)
PT/OT at SNF as tolerated. ?

## 2024-01-25 NOTE — Assessment & Plan Note (Signed)
 Current H/H 12.2/37.6 with normochromic, normocytic indices.  Bleeding dyscrasias denied except for bruising with mild trauma in the context of 81 mg aspirin  prophylaxis.  Monitor for any bleeding dyscrasias at the SNF.

## 2024-01-25 NOTE — Progress Notes (Unsigned)
 "   NURSING HOME LOCATION:  [facility] ROOM NUMBER: [id number]  CODE STATUS: DNR  PCP: [facility]  This is a comprehensive admission note to this SNFperformed on this date less than 30 days from date of admission. Included are preadmission medical/surgical history; reconciled medication list; family history; social history and comprehensive review of systems.  Corrections and additions to the records were documented. Comprehensive physical exam was also performed. Additionally a clinical summary was entered for each active diagnosis pertinent to this admission in the Problem List to enhance continuity of care.  HPI: He was hospitalized 1/7 - 01/21/2024 with lower extremity weakness presenting with hip pain and difficulty ambulating.  MRI revealed L3-4 disc extrusion.  No surgical intervention was recommended.  Labs revealed normochromic, normocytic anemia with H/H of 12.2/37.6.  PT/OT consulted and recommended SNF placement for rehab.  Past medical and surgical history: Includes essential hypertension; CAD; chronic lumbar disc disease with radicularopathy; dyslipidemia; history of stroke; hypothyroidism; and BPH with history of urinary obstruction.   Surgeries and procedures include CABG.  Family history: reviewed, non contributory due to advanced age.  Social history: Non-smoker; nondrinker.  He is a retired careers adviser; he taught physiology, anatomy, and biology.  He has a BA and Masters from [employer]  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  He also studies a Bible written in Spanish as his son has been a missionary in [place].   Review of systems: He states that he has had difficulty with ambulation and has been using a cane for at least a year.  He describes the intermittent fourth left vertebrae pain as deathly. He validates some dysphagia.  He describes right thoracic pain intermittently related to  previous herpes zoster infection.  Gabapentin  is of benefit.  He also describes some constipation.  He is on Flomax  because of decreased urine flow.  In the context of the anemia; he describes some bruising on prophylactic ASA anticoagulation regimen.  Constitutional: No fever, significant weight change, fatigue  Eyes: No redness, discharge, pain, vision change ENT/mouth: No nasal congestion, purulent discharge, earache, change in hearing, sore throat  Cardiovascular: No palpitations, paroxysmal nocturnal dyspnea, claudication, edema  Respiratory: No cough, sputum production, hemoptysis, DOE, significant snoring, apnea  Gastrointestinal: No heartburn,abdominal pain, nausea /vomiting, rectal bleeding, melena Genitourinary: No dysuria, hematuria, pyuria, incontinence, nocturia Dermatologic: No rash, pruritus, change in appearance of skin Neurologic: No dizziness, headache, syncope, seizures, numbness, tingling Psychiatric: No significant anxiety, depression, insomnia, anorexia Endocrine: No change in hair/skin/nails, excessive thirst, excessive hunger, excessive urination  Hematologic/lymphatic: No significant bruising, lymphadenopathy Allergy/immunology: No itchy/watery eyes, significant sneezing, urticaria, angioedema  Physical exam:  Pertinent or positive findings: He is thin and suboptimally nourished.  Initially he was asleep exhibiting hypopnea without snoring or frank apnea.  Upon awakening puffy lower lids are noted. His hair is disheveled and thin over the crown.  Eyebrows are also decreased in density.  The left nasolabial fold is decreased compared to the right.  He has a beard and mustache.   Heart sounds are distant; there is a slight increase in S1.  Breath sounds are decreased.  Pedal pulses are not palpable.  Limb atrophy and interosseous wasting are noted.  He has scarring and small ecchymotic lesions over the forearms.  There is splotchy, irregular faint hyperpigmentation over the  left shin greater than the right.  Slight clubbing of the nailbeds suggested.  The left lower extremity  is stronger than the right lower extremity to opposition.  There is decreased active flexion of the left hand.  General appearance:  no acute distress, increased work of breathing is present.   Lymphatic: No lymphadenopathy about the head, neck, axilla. Eyes: No conjunctival inflammation or lid edema is present. There is no scleral icterus. Ears:  External ear exam shows no significant lesions or deformities.   Nose:  External nasal examination shows no deformity or inflammation. Nasal mucosa are pink and moist without lesions, exudates Oral exam: Lips and gums are healthy appearing.There is no oropharyngeal erythema or exudate. Neck:  No thyromegaly, masses, tenderness noted.    Heart:  No gallop, murmur, click, rub.  Lungs:  without wheezes, rhonchi, rales, rubs. Abdomen: Bowel sounds are normal.  Abdomen is soft and nontender with no organomegaly, hernias, masses. GU: Deferred  Extremities:  No cyanosis, edema. Neurologic exam: Balance, Rhomberg, finger to nose testing could not be completed due to clinical state Skin: Warm & dry w/o tenting. No significant rash.  See clinical summary under each active problem in the Problem List with associated updated therapeutic plan :  Lumbar disc disease with radiculopathy PT/OT at SNF.  Mild anemia Current H/H 12.2/37.6 with normochromic, normocytic indices.  Bleeding dyscrasias denied except for bruising with mild trauma in the context of 81 mg aspirin  prophylaxis.  Monitor for any bleeding dyscrasias at the SNF.  Weakness of lower extremity PT/OT at  SNF as tolerated.  "

## 2024-01-25 NOTE — Assessment & Plan Note (Signed)
PT/OT at SNF °

## 2024-01-31 ENCOUNTER — Other Ambulatory Visit (HOSPITAL_COMMUNITY)
Admission: RE | Admit: 2024-01-31 | Discharge: 2024-01-31 | Disposition: A | Discharge status: Home / Self Care | Source: Skilled Nursing Facility | Attending: Adult Health | Admitting: Adult Health

## 2024-01-31 DIAGNOSIS — I10 Essential (primary) hypertension: Secondary | ICD-10-CM | POA: Insufficient documentation

## 2024-01-31 LAB — BASIC METABOLIC PANEL WITH GFR
Anion gap: 11 (ref 5–15)
BUN: 25 mg/dL — ABNORMAL HIGH (ref 8–23)
CO2: 25 mmol/L (ref 22–32)
Calcium: 8.9 mg/dL (ref 8.9–10.3)
Chloride: 104 mmol/L (ref 98–111)
Creatinine, Ser: 1.06 mg/dL (ref 0.61–1.24)
GFR, Estimated: >60 mL/min
Glucose, Bld: 144 mg/dL — ABNORMAL HIGH (ref 70–99)
Potassium: 4.5 mmol/L (ref 3.5–5.1)
Sodium: 140 mmol/L (ref 135–145)

## 2024-01-31 LAB — HEMOGLOBIN AND HEMATOCRIT, BLOOD
HCT: 43.4 % (ref 39.0–52.0)
Hemoglobin: 14.2 g/dL (ref 13.0–17.0)

## 2024-02-09 ENCOUNTER — Non-Acute Institutional Stay (SKILLED_NURSING_FACILITY): Payer: Self-pay | Admitting: Internal Medicine

## 2024-02-09 ENCOUNTER — Encounter: Payer: Self-pay | Admitting: Internal Medicine

## 2024-02-09 DIAGNOSIS — E039 Hypothyroidism, unspecified: Secondary | ICD-10-CM

## 2024-02-09 DIAGNOSIS — I10 Essential (primary) hypertension: Secondary | ICD-10-CM | POA: Insufficient documentation

## 2024-02-09 DIAGNOSIS — D649 Anemia, unspecified: Secondary | ICD-10-CM | POA: Diagnosis not present

## 2024-02-09 DIAGNOSIS — M5116 Intervertebral disc disorders with radiculopathy, lumbar region: Secondary | ICD-10-CM | POA: Diagnosis not present

## 2024-02-09 NOTE — Assessment & Plan Note (Signed)
 Past medical history includes a diagnosis of hypertension.  Today's blood pressure is an outlier.  Here at the SNF blood pressures have ranged from a low of 103/60 up to 148/62.  The majority of the blood pressures are less than 135 systolic.  He is not on an antihypertensive per se but is on tamsulosin  which may impact the blood pressure.  Continue to monitor and address the blood pressure average, not outliers.

## 2024-02-09 NOTE — Progress Notes (Unsigned)
 "   NURSING HOME LOCATION:  [facility] ROOM NUMBER:  [id number]  CODE STATUS:  DNR  PCP: [facility]   This is a nursing facility follow up visit for discharge from this facility.  Interim medical record and care since last SNF visit was updated with review of diagnostic studies and change in clinical status since last visit were documented.  HPI: He was admitted to this facility 01/21/2024 after being hospitalized 1/7 - 1/9 with lower extremity weakness presenting with hip pain and difficulty ambulating.  MRI revealed L3-4 disc extrusion.  Neurosurgery did not recommend surgical intervention.  Normochromic, normocytic anemia was present with H/H of 12.2/37.6.  PT/OT recommended SNF placement for rehab. Labs were updated on 1/19 revealing minimal prerenal azotemia with a BUN of 25.  Creatinine was 1.06 and GFR greater than 60 indicating CKD stage II.  Glucose was mildly elevated 144.  The last A1c on record was 6.1% on 10/24/2009.  The anemia resolved with H/H of 14.2/43.4. He progressed at the SNF with PT/OT, able to ambulate 125 feet with a rolling walker with standby assistance.  Upper and lower body ADLs required only supervision.  Stand and pivot and toileting required contact-guard assistance only.  He is now to be discharged to [facility] in [place], [place] .  Review of systems: He states that Tylenol  does help the pain in the concave surface area of the right hip.  He does describe intermittent pain in the medial calf area typically early in the morning, typically 2-3 AM.  He validates that the pain is dramatically better than prior to admission when he described it as deathly.  He typically experiences nocturia up to 4 times at night.  Constitutional: No fever, significant weight change, fatigue  Eyes: No redness, discharge, pain, vision change ENT/mouth: No nasal congestion,  purulent discharge, earache, change in hearing, sore throat   Cardiovascular: No chest pain, palpitations, paroxysmal nocturnal dyspnea, claudication, edema  Respiratory: No cough, sputum production, hemoptysis, DOE, significant snoring, apnea   Gastrointestinal: No heartburn, dysphagia, abdominal pain, nausea /vomiting, rectal bleeding, melena, change in bowels Genitourinary: No dysuria, hematuria, pyuria, incontinence Dermatologic: No rash, pruritus, change in appearance of skin Neurologic: No dizziness, headache, syncope, seizures, numbness, tingling Psychiatric: No significant anxiety, depression, insomnia, anorexia Endocrine: No change in hair/skin/nails, excessive thirst, excessive hunger, excessive urination  Hematologic/lymphatic: No significant bruising, lymphadenopathy, abnormal bleeding Allergy/immunology: No itchy/watery eyes, significant sneezing, urticaria, angioedema  Physical exam:  Pertinent or positive findings: He is thin and appears suboptimally nourished.  Hair is thin over the crown.  He has a beard and mustache.  Eyebrows are thin.  There is a dilated vein over the base of the neck anteriorly.  Heart sounds are distant; heart rhythm is irregular due to pauses.  Breath sounds are decreased.  Pedal pulses are decreased to palpation.  Limb atrophy and interosseous wasting are noted.  Straight leg raising is negative.  He has isolated osteoarthritic changes of the right hand.  There is irregular faint scarring of the left shin.  General appearance: no acute distress, increased work of breathing is present.   Lymphatic: No lymphadenopathy about the head, neck, axilla. Eyes: No conjunctival inflammation or lid edema is present. There is no scleral icterus. Ears:  External ear exam shows no significant lesions or deformities.   Nose:  External nasal examination shows no deformity or inflammation. Nasal mucosa are pink and moist without lesions, exudates Oral exam:  Lips and gums are  healthy appearing. There is no oropharyngeal erythema or  exudate. Neck:  No thyromegaly, masses, tenderness noted.    Heart:  No gallop, murmur, click, rub .  Lungs: Chest clear to auscultation without wheezes, rhonchi, rales, rubs. Abdomen: Bowel sounds are normal. Abdomen is soft and nontender with no organomegaly, hernias, masses. GU: Deferred  Extremities:  No cyanosis, clubbing, edema  Neurologic exam :Balance, Rhomberg, finger to nose testing could not be completed due to clinical state Skin: Warm & dry w/o tenting. No significant lesions or rash.  See summary under each active problem in the Problem List with associated updated therapeutic plan:  Lumbar disc disease with radiculopathy He has progressed at [facility] in PT/OT; he is able to ambulate 125 feet with a rolling walker and standby assistance.  Upper and lower body ADLs are supervision only.  Stand and pivot and toileting required only contact-guard assistance. He is being discharged to [facility] in [place], [place] .  Mild anemia Anemia has resolved with current H/H of 14.2/43.4.  No bleeding dyscrasias reported.  Acquired hypothyroidism .  The last TSH on record and Epic was in 2011.  This can be updated at the ALF.  Essential hypertension Past medical history includes a diagnosis of hypertension.  Today's blood pressure is an outlier.  Here at the SNF blood pressures have ranged from a low of 103/60 up to 148/62.  The majority of the blood pressures are less than 135 systolic.  He is not on an antihypertensive per se but is on tamsulosin  which may impact the blood pressure.  Continue to monitor and address the blood pressure average, not outliers.   "

## 2024-02-09 NOTE — Assessment & Plan Note (Signed)
.    The last TSH on record in Epic was in 2011.  He states that he is scheduled to have the TSH updated next week at his PCP office.

## 2024-02-09 NOTE — Assessment & Plan Note (Signed)
 He has progressed well at [facility] and PT/OT; he is able to ambulate 125 feet with a rolling walker and standby assistance.  Upper and lower body ADLs are supervised and only.  Stand and pivot and toileting required only contact-guard assistance. He has been discharged to [facility] in [place], [place] .

## 2024-02-09 NOTE — Assessment & Plan Note (Signed)
 Anemia has resolved with current H/H of 14.2/43.4.  No bleeding dyscrasias reported.

## 2024-02-10 ENCOUNTER — Encounter: Payer: Self-pay | Admitting: Adult Health

## 2024-02-10 NOTE — Progress Notes (Signed)
 This encounter was created in error - please disregard.
